# Patient Record
Sex: Male | Born: 1955 | Race: Black or African American | Hispanic: No | Marital: Married | Smoking: Never smoker
Health system: Southern US, Community
[De-identification: ages and names within clinical notes are randomized; demographics above are authoritative.]

## PROBLEM LIST (undated history)

## (undated) DIAGNOSIS — G629 Polyneuropathy, unspecified: Secondary | ICD-10-CM

## (undated) DIAGNOSIS — G8929 Other chronic pain: Secondary | ICD-10-CM

---

## 1998-01-14 ENCOUNTER — Emergency Department (HOSPITAL_COMMUNITY): Admission: EM | Admit: 1998-01-14 | Discharge: 1998-01-14 | Payer: Self-pay | Admitting: Emergency Medicine

## 2008-06-05 ENCOUNTER — Emergency Department (HOSPITAL_COMMUNITY): Admission: EM | Admit: 2008-06-05 | Discharge: 2008-06-05 | Payer: Self-pay | Admitting: Family Medicine

## 2008-06-25 ENCOUNTER — Emergency Department (HOSPITAL_COMMUNITY): Admission: EM | Admit: 2008-06-25 | Discharge: 2008-06-25 | Payer: Self-pay | Admitting: Family Medicine

## 2008-07-13 ENCOUNTER — Emergency Department (HOSPITAL_COMMUNITY): Admission: EM | Admit: 2008-07-13 | Discharge: 2008-07-13 | Payer: Self-pay | Admitting: Emergency Medicine

## 2008-08-07 ENCOUNTER — Emergency Department (HOSPITAL_COMMUNITY): Admission: EM | Admit: 2008-08-07 | Discharge: 2008-08-07 | Payer: Self-pay | Admitting: Family Medicine

## 2008-08-22 ENCOUNTER — Emergency Department (HOSPITAL_COMMUNITY): Admission: EM | Admit: 2008-08-22 | Discharge: 2008-08-22 | Payer: Self-pay | Admitting: Emergency Medicine

## 2008-09-13 ENCOUNTER — Emergency Department (HOSPITAL_COMMUNITY): Admission: EM | Admit: 2008-09-13 | Discharge: 2008-09-13 | Payer: Self-pay | Admitting: Emergency Medicine

## 2008-11-30 ENCOUNTER — Ambulatory Visit: Payer: Self-pay | Admitting: Internal Medicine

## 2008-12-06 ENCOUNTER — Ambulatory Visit: Payer: Self-pay | Admitting: *Deleted

## 2009-07-24 ENCOUNTER — Ambulatory Visit (HOSPITAL_COMMUNITY): Admission: RE | Admit: 2009-07-24 | Discharge: 2009-07-24 | Payer: Self-pay

## 2009-07-24 ENCOUNTER — Ambulatory Visit: Payer: Self-pay | Admitting: Family Medicine

## 2013-05-10 ENCOUNTER — Emergency Department (HOSPITAL_COMMUNITY): Payer: Medicare Other

## 2013-05-10 ENCOUNTER — Emergency Department (HOSPITAL_COMMUNITY)
Admission: EM | Admit: 2013-05-10 | Discharge: 2013-05-10 | Disposition: A | Discharge status: Home / Self Care | Payer: Medicare Other | Attending: Emergency Medicine | Admitting: Emergency Medicine

## 2013-05-10 ENCOUNTER — Encounter (HOSPITAL_COMMUNITY): Payer: Self-pay | Admitting: Emergency Medicine

## 2013-05-10 DIAGNOSIS — M1710 Unilateral primary osteoarthritis, unspecified knee: Secondary | ICD-10-CM | POA: Insufficient documentation

## 2013-05-10 DIAGNOSIS — M545 Low back pain, unspecified: Secondary | ICD-10-CM | POA: Insufficient documentation

## 2013-05-10 DIAGNOSIS — IMO0002 Reserved for concepts with insufficient information to code with codable children: Secondary | ICD-10-CM | POA: Insufficient documentation

## 2013-05-10 DIAGNOSIS — R030 Elevated blood-pressure reading, without diagnosis of hypertension: Secondary | ICD-10-CM | POA: Insufficient documentation

## 2013-05-10 DIAGNOSIS — M1712 Unilateral primary osteoarthritis, left knee: Secondary | ICD-10-CM

## 2013-05-10 DIAGNOSIS — Z79899 Other long term (current) drug therapy: Secondary | ICD-10-CM | POA: Insufficient documentation

## 2013-05-10 DIAGNOSIS — M19079 Primary osteoarthritis, unspecified ankle and foot: Secondary | ICD-10-CM | POA: Insufficient documentation

## 2013-05-10 DIAGNOSIS — M19072 Primary osteoarthritis, left ankle and foot: Secondary | ICD-10-CM

## 2013-05-10 DIAGNOSIS — IMO0001 Reserved for inherently not codable concepts without codable children: Secondary | ICD-10-CM

## 2013-05-10 DIAGNOSIS — G8929 Other chronic pain: Secondary | ICD-10-CM | POA: Insufficient documentation

## 2013-05-10 DIAGNOSIS — R209 Unspecified disturbances of skin sensation: Secondary | ICD-10-CM | POA: Insufficient documentation

## 2013-05-10 DIAGNOSIS — M1711 Unilateral primary osteoarthritis, right knee: Secondary | ICD-10-CM

## 2013-05-10 HISTORY — DX: Other chronic pain: G89.29

## 2013-05-10 MED ORDER — TRAMADOL HCL 50 MG PO TABS: 50.0000 mg | ORAL_TABLET | Freq: Four times a day (QID) | ORAL | Status: DC | PRN

## 2013-05-10 MED ORDER — AMLODIPINE BESYLATE 10 MG PO TABS
10.0000 mg | ORAL_TABLET | Freq: Once | ORAL | Status: AC
  Administered 2013-05-10: 10 mg via ORAL
  Filled 2013-05-10: qty 1

## 2013-05-10 MED ORDER — OXYCODONE-ACETAMINOPHEN 5-325 MG PO TABS
2.0000 | ORAL_TABLET | Freq: Once | ORAL | Status: AC
  Administered 2013-05-10: 2 via ORAL
  Filled 2013-05-10: qty 2

## 2013-05-10 MED ORDER — AMLODIPINE BESYLATE 10 MG PO TABS: 10.0000 mg | ORAL_TABLET | Freq: Every day | ORAL | Status: AC

## 2013-05-10 MED ORDER — ACETAMINOPHEN 500 MG PO TABS: 500.0000 mg | ORAL_TABLET | Freq: Four times a day (QID) | ORAL | Status: DC | PRN

## 2013-05-10 NOTE — ED Notes (Signed)
Ortho paged. 

## 2013-05-10 NOTE — ED Provider Notes (Signed)
CSN: [id number]     Arrival date & time 05/10/13  1710 History  This chart was scribed for non-physician practitioner Junius Finner working with Hilario Quarry, MD by Danella Maiers, ED Scribe. This patient was seen in room TR08C/TR08C and the patient's care was started at 5:36 PM.     Chief Complaint  Patient presents with  . Knee Pain   The history is provided by the patient. No language interpreter was used.   HPI Comments: Jacob Garza is a 57 y.o. male who presents to the Emergency Department complaining of exacerbation of chronic bilateral knee pain that has worsened to moderate throbbing pain recently. He denies new injuries. He has been walking with a walker for almost a year and states his knees feel weak. He also reports new left ankle pain with numbness and tingling. He also reports lower back pain. He has been taking Aleve and icy hot with minimal relief. He states it has been a while since he has seen an orthopedist and he does not have a PCP. He states he would like to have x-rays done.  Past Medical History  Diagnosis Date  . Chronic pain    History reviewed. No pertinent past surgical history. History reviewed. No pertinent family history. History  Substance Use Topics  . Smoking status: Never Smoker   . Smokeless tobacco: Not on file  . Alcohol Use: No    Review of Systems  Musculoskeletal: Positive for arthralgias.  All other systems reviewed and are negative.    Allergies  Review of patient's allergies indicates no known allergies.  Home Medications   Current Outpatient Rx  Name  Route  Sig  Dispense  Refill  . Menthol, Topical Analgesic, (ICY HOT EX)   Apply externally   Apply 1 application topically daily.         . naproxen sodium (ANAPROX) 220 MG tablet   Oral   Take 220 mg by mouth 2 (two) times daily with a meal.         . Omega-3 Fatty Acids (FISH OIL PO)   Oral   Take 2 tablets by mouth daily.         Marland Kitchen trolamine salicylate  (ASPERCREME) 10 % cream   Topical   Apply 1 application topically daily.         Marland Kitchen acetaminophen (TYLENOL) 500 MG tablet   Oral   Take 1 tablet (500 mg total) by mouth every 6 (six) hours as needed.   30 tablet   0   . amLODipine (NORVASC) 10 MG tablet   Oral   Take 1 tablet (10 mg total) by mouth daily.   30 tablet   0   . traMADol (ULTRAM) 50 MG tablet   Oral   Take 1 tablet (50 mg total) by mouth every 6 (six) hours as needed.   15 tablet   0    BP 161/100  Pulse 63  Temp(Src) 98.6 F (37 C) (Oral)  Resp 16  SpO2 99% Physical Exam  Nursing note and vitals reviewed. Constitutional: He is oriented to person, place, and time. He appears well-developed and well-nourished.  Pt sitting at edge of exam bed, sitting in a stiffened position.  Walker at his side.  HENT:  Head: Normocephalic and atraumatic.  Eyes: EOM are normal.  Neck: Normal range of motion.  Cardiovascular: Normal rate.   Pulmonary/Chest: Effort normal.  Musculoskeletal: He exhibits edema and tenderness.  Limited ROM to knees bilaterally secondary to  pain. Tenderness on the left knee - medial and lateral joint space. Tenderness on the right knee- only medial joint.  Neurological: He is alert and oriented to person, place, and time.  Skin: Skin is warm and dry. No rash noted. No erythema.  Skin in tact, no ecchymosis or erythema.  Psychiatric: He has a normal mood and affect. His behavior is normal.    ED Course  Procedures (including critical care time) Medications  oxyCODONE-acetaminophen (PERCOCET/ROXICET) 5-325 MG per tablet 2 tablet (2 tablets Oral Given 05/10/13 1918)  amLODipine (NORVASC) tablet 10 mg (10 mg Oral Given 05/10/13 2001)    DIAGNOSTIC STUDIES: Oxygen Saturation is 99% on RA, normal by my interpretation.    COORDINATION OF CARE: 6:00 PM- Discussed treatment plan with pt which includes left and right knee x-rays and left ankle x-ray. Pt agrees to plan.    Labs Review Labs  Reviewed - No data to display Imaging Review Dg Ankle Complete Left  05/10/2013   CLINICAL DATA:  Left ankle pain.  EXAM: LEFT ANKLE COMPLETE - 3+ VIEW  COMPARISON:  None.  FINDINGS: There is no evidence of fracture, dislocation, or joint effusion. Moderate to severe osteoarthritis is seen involving the ankle joint. No other bone lesions identified.  IMPRESSION: No acute findings.  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   Electronically Signed   By: Myles Rosenthal M.D.   On: 05/10/2013 18:54   Dg Knee Complete 4 Views Left  05/10/2013   CLINICAL DATA:  Left knee pain.  EXAM: LEFT KNEE - COMPLETE 4+ VIEW  COMPARISON:  July 24, 2009.  FINDINGS: No fracture or dislocation is noted. No joint effusion is noted. Severe narrowing of both the medial and lateral joint spaces is noted as well as the patellofemoral space. Significant osteophyte formation is noted as well.  IMPRESSION: Severe tricompartmental degenerative joint disease is noted. No acute abnormality seen.   Electronically Signed   By: Roque Lias M.D.   On: 05/10/2013 18:39   Dg Knee Complete 4 Views Right  05/10/2013   CLINICAL DATA:  Right knee pain.  EXAM: RIGHT KNEE - COMPLETE 4+ VIEW  COMPARISON:  July 24, 2009.  FINDINGS: No fracture or dislocation is noted. No joint effusion is noted. Severe narrowing of the medial and lateral joint spaces is noted as well as the patellofemoral space. Mild osteophyte formation is noted.  IMPRESSION: Severe tricompartmental degenerative joint disease.   Electronically Signed   By: Roque Lias M.D.   On: 05/10/2013 18:40    EKG Interpretation   None       MDM   1. Osteoarthritis of ankle, left   2. Osteoarthritis of left knee   3. Osteoarthritis of right knee   4. Elevated BP    Pt has extensive osteoarthritis in both knees, and left ankle.  Greatest in left knee.  No acute abnormalities seen.    Pt also found to have elevated BP in ED.  Pt states he does not have PCP and has not  been tx for HTN before.  Upon discharge, pt was found to have BP of 198/2mmHg.  Will start pt on 10mg  amlodipine.  Strongly encouraged pt to establish a PCP for further evaluation and tx of HTN and ongoing healthcare needs.  Also discussed f/u with [facility] orthopedist as pt may benefit greatly from knee joint and ankle joint injections.  Due to extensive OA in left knee, will likely need knee replacement at some point.  Rx: knees  sleeves, ASO left ankle, tramadol, acetaminophen, and amlodipine 10mg . Return precautions provided. Pt verbalized understanding and agreement with tx plan.  I personally performed the services described in this documentation, which was scribed in my presence. The recorded information has been reviewed and is accurate.    Junius Finner, PA-C 05/10/13 2029

## 2013-05-10 NOTE — Progress Notes (Signed)
Orthopedic Tech Progress Note Patient Details:  Jacob Garza [birth year] [id number]  Ortho Devices Type of Ortho Device: ASO;Knee Sleeve Ortho Device/Splint Location: both LLE Ortho Device/Splint Interventions: Ordered;Application   [name] 05/10/2013, 7:32 PM

## 2013-05-10 NOTE — ED Notes (Signed)
Pt c/o bilateral knee pain that is chronic in nature; pt sts left ankle pain; pt denies new injury

## 2013-05-14 NOTE — ED Provider Notes (Signed)
History/physical exam/procedure(s) were performed by non-physician practitioner and as supervising physician I was immediately available for consultation/collaboration. I have reviewed all notes and am in agreement with care and plan.   Romonda Parker S Trudee Chirino, MD 05/14/13 1523 

## 2013-06-19 ENCOUNTER — Ambulatory Visit: Payer: Medicare Other

## 2013-07-13 ENCOUNTER — Ambulatory Visit: Payer: Medicare Other | Admitting: Internal Medicine

## 2016-08-07 ENCOUNTER — Ambulatory Visit (HOSPITAL_COMMUNITY)
Admission: EM | Admit: 2016-08-07 | Discharge: 2016-08-07 | Disposition: A | Discharge status: Home / Self Care | Payer: Medicare Other | Attending: Family Medicine | Admitting: Family Medicine

## 2016-08-07 ENCOUNTER — Encounter (HOSPITAL_COMMUNITY): Payer: Self-pay

## 2016-08-07 DIAGNOSIS — M150 Primary generalized (osteo)arthritis: Secondary | ICD-10-CM

## 2016-08-07 DIAGNOSIS — M130 Polyarthritis, unspecified: Secondary | ICD-10-CM | POA: Diagnosis not present

## 2016-08-07 DIAGNOSIS — M159 Polyosteoarthritis, unspecified: Secondary | ICD-10-CM

## 2016-08-07 MED ORDER — TRAMADOL HCL 50 MG PO TABS: 50.0000 mg | ORAL_TABLET | Freq: Four times a day (QID) | ORAL | 0 refills | Status: DC | PRN

## 2016-08-07 MED ORDER — NAPROXEN 375 MG PO TABS: 375.0000 mg | ORAL_TABLET | Freq: Two times a day (BID) | ORAL | 0 refills | Status: DC

## 2016-08-07 NOTE — ED Provider Notes (Signed)
CSN: [id number]     Arrival date & time 08/07/16  1843 History   First MD Initiated Contact with Patient 08/07/16 2012     Chief Complaint  Patient presents with  . Joint Swelling   (Consider location/radiation/quality/duration/timing/severity/associated sxs/prior Treatment) 61 year old male complaining of joint pain and swelling for several years. It is noted that in 2014 he was in the emergency department with the same complaints and was told he needed to follow-up with a primary care provider. He states that he is making no effort to do that. He states that his joint pain and swelling is getting worse. He has been diagnosed with osteoarthritis. Has been taking Aleve and Tylenol without relief.      Past Medical History:  Diagnosis Date  . Chronic pain    History reviewed. No pertinent surgical history. No family history on file. Social History  Substance Use Topics  . Smoking status: Never Smoker  . Smokeless tobacco: Never Used  . Alcohol use No    Review of Systems  Constitutional: Negative.   HENT: Negative.   Respiratory: Negative.   Cardiovascular: Negative.   Musculoskeletal: Positive for arthralgias.  Neurological: Negative.   All other systems reviewed and are negative.   Allergies  Patient has no known allergies.  Home Medications   Prior to Admission medications   Medication Sig Start Date End Date Taking? Authorizing Provider  acetaminophen (TYLENOL) 500 MG tablet Take 1 tablet (500 mg total) by mouth every 6 (six) hours as needed. 05/10/13  Yes Junius Finner, PA-C  naproxen sodium (ANAPROX) 220 MG tablet Take 220 mg by mouth 2 (two) times daily with a meal.   Yes Historical Provider, MD  amLODipine (NORVASC) 10 MG tablet Take 1 tablet (10 mg total) by mouth daily. 05/10/13   Junius Finner, PA-C  Menthol, Topical Analgesic, (ICY HOT EX) Apply 1 application topically daily.    Historical Provider, MD  naproxen (NAPROSYN) 375 MG tablet Take 1 tablet (375 mg  total) by mouth 2 (two) times daily. Take with food 08/07/16   Hayden Rasmussen, NP  Omega-3 Fatty Acids (FISH OIL PO) Take 2 tablets by mouth daily.    Historical Provider, MD  traMADol (ULTRAM) 50 MG tablet Take 1 tablet (50 mg total) by mouth every 6 (six) hours as needed. 08/07/16   Hayden Rasmussen, NP  trolamine salicylate (ASPERCREME) 10 % cream Apply 1 application topically daily.    Historical Provider, MD   Meds Ordered and Administered this Visit  Medications - No data to display  BP 167/80 (BP Location: Left Arm)   Pulse (!) 51   Temp 98.3 F (36.8 C) (Oral)   Resp 18   SpO2 100%  No data found.   Physical Exam  Constitutional: He is oriented to person, place, and time. He appears well-developed and well-nourished.  HENT:  Head: Normocephalic and atraumatic.  Neck: Normal range of motion. Neck supple.  Pulmonary/Chest: Effort normal.  Musculoskeletal: He exhibits edema, tenderness and deformity.  Swelling of both knees and both ankles. Minor swelling of the hands with joint deformities characteristic of a osteoarthritis. No erythema or signs of localized infection.  Neurological: He is alert and oriented to person, place, and time. No sensory deficit.  Skin: Skin is warm.  Psychiatric: He has a normal mood and affect.  Nursing note and vitals reviewed.   Urgent Care Course     Procedures (including critical care time)  Labs Review Labs Reviewed - No data to display  Imaging Review No results found.   Visual Acuity Review  Right Eye Distance:   Left Eye Distance:   Bilateral Distance:    Right Eye Near:   Left Eye Near:    Bilateral Near:         MDM   1. Primary osteoarthritis involving multiple joints   2. Polyarthritis   You have bad osteoarthritis of your joints and you need to see a primary care provider for additional care. There are telephone numbers listed in your instructions on the first page as well as in a dark rectangular box in the back of your  instructions. Take the medications as prescribed. He will also have mild elevation in your blood pressure he will also need to have that evaluated as well. Meds ordered this encounter  Medications  . naproxen (NAPROSYN) 375 MG tablet    Sig: Take 1 tablet (375 mg total) by mouth 2 (two) times daily. Take with food    Dispense:  20 tablet    Refill:  0    Order Specific Question:   Supervising Provider    Answer:   Linna Hoff [phone]  . traMADol (ULTRAM) 50 MG tablet    Sig: Take 1 tablet (50 mg total) by mouth every 6 (six) hours as needed.    Dispense:  15 tablet    Refill:  0    Order Specific Question:   Supervising Provider    Answer:   Linna Hoff [[id number]]       Hayden Rasmussen, NP 08/07/16 2031

## 2016-08-07 NOTE — ED Triage Notes (Signed)
Pt having swelling in his ankles, wrist, knees, hands and fingers for over a year. Unable to do his ADL. Taking advil and tylenol

## 2016-08-07 NOTE — Discharge Instructions (Signed)
You have bad osteoarthritis of your joints and you need to see a primary care provider for additional care. There are telephone numbers listed in your instructions on the first page as well as in a dark rectangular box in the back of your instructions. Take the medications as prescribed. He will also have mild elevation in your blood pressure he will also need to have that evaluated as well.

## 2018-06-14 ENCOUNTER — Emergency Department (HOSPITAL_COMMUNITY): Payer: Medicare Other

## 2018-06-14 ENCOUNTER — Emergency Department (HOSPITAL_COMMUNITY)
Admission: EM | Admit: 2018-06-14 | Discharge: 2018-06-15 | Disposition: A | Discharge status: Home / Self Care | Payer: Medicare Other | Attending: Emergency Medicine | Admitting: Emergency Medicine

## 2018-06-14 ENCOUNTER — Encounter (HOSPITAL_COMMUNITY): Payer: Self-pay | Admitting: Emergency Medicine

## 2018-06-14 DIAGNOSIS — M7989 Other specified soft tissue disorders: Secondary | ICD-10-CM | POA: Diagnosis not present

## 2018-06-14 DIAGNOSIS — S8991XA Unspecified injury of right lower leg, initial encounter: Secondary | ICD-10-CM | POA: Diagnosis not present

## 2018-06-14 DIAGNOSIS — Z79899 Other long term (current) drug therapy: Secondary | ICD-10-CM | POA: Insufficient documentation

## 2018-06-14 DIAGNOSIS — S99912A Unspecified injury of left ankle, initial encounter: Secondary | ICD-10-CM | POA: Diagnosis not present

## 2018-06-14 DIAGNOSIS — M25562 Pain in left knee: Secondary | ICD-10-CM | POA: Diagnosis not present

## 2018-06-14 DIAGNOSIS — M25572 Pain in left ankle and joints of left foot: Secondary | ICD-10-CM | POA: Diagnosis not present

## 2018-06-14 DIAGNOSIS — W1789XA Other fall from one level to another, initial encounter: Secondary | ICD-10-CM | POA: Insufficient documentation

## 2018-06-14 DIAGNOSIS — M069 Rheumatoid arthritis, unspecified: Secondary | ICD-10-CM | POA: Diagnosis not present

## 2018-06-14 DIAGNOSIS — S6991XA Unspecified injury of right wrist, hand and finger(s), initial encounter: Secondary | ICD-10-CM | POA: Diagnosis not present

## 2018-06-14 DIAGNOSIS — M25531 Pain in right wrist: Secondary | ICD-10-CM | POA: Diagnosis not present

## 2018-06-14 DIAGNOSIS — S59912A Unspecified injury of left forearm, initial encounter: Secondary | ICD-10-CM | POA: Diagnosis not present

## 2018-06-14 DIAGNOSIS — M25561 Pain in right knee: Secondary | ICD-10-CM

## 2018-06-14 DIAGNOSIS — M25532 Pain in left wrist: Secondary | ICD-10-CM | POA: Diagnosis not present

## 2018-06-14 DIAGNOSIS — S8992XA Unspecified injury of left lower leg, initial encounter: Secondary | ICD-10-CM | POA: Diagnosis not present

## 2018-06-14 DIAGNOSIS — M79632 Pain in left forearm: Secondary | ICD-10-CM | POA: Diagnosis not present

## 2018-06-14 DIAGNOSIS — W19XXXA Unspecified fall, initial encounter: Secondary | ICD-10-CM

## 2018-06-14 NOTE — ED Notes (Signed)
Patient transported to X-ray 

## 2018-06-14 NOTE — ED Provider Notes (Signed)
[facility] EMERGENCY DEPARTMENT Provider Note   CSN: [id number] Arrival date & time: 06/14/18  2100     History   Chief Complaint Chief Complaint  Patient presents with  . Fall  . Leg Pain    HPI Jacob Garza is a 62 y.o. male.  Patient presents to the emergency department with a chief complaint of bilateral wrist and bilateral knee pain.  He states that he slipped while getting out of the bathtub yesterday.  He has severe rheumatoid arthritis, and does not ambulate at baseline.  He uses a wheelchair.  While transferring, he fell to the ground striking his left forearm, catching himself with his right wrist, and landing on bilateral knees.  He states that his symptoms worsened today.  No treatments prior to arrival.  He did not hit his head or pass out.  Denies any other associated symptoms.  The history is provided by the patient. No language interpreter was used.    Past Medical History:  Diagnosis Date  . Chronic pain     There are no active problems to display for this patient.   History reviewed. No pertinent surgical history.      Home Medications    Prior to Admission medications   Medication Sig Start Date End Date Taking? Authorizing Provider  acetaminophen (TYLENOL) 500 MG tablet Take 1 tablet (500 mg total) by mouth every 6 (six) hours as needed. 05/10/13   Lurene Shadow, PA-C  amLODipine (NORVASC) 10 MG tablet Take 1 tablet (10 mg total) by mouth daily. 05/10/13   Lurene Shadow, PA-C  Menthol, Topical Analgesic, (ICY HOT EX) Apply 1 application topically daily.    [provider]  naproxen (NAPROSYN) 375 MG tablet Take 1 tablet (375 mg total) by mouth 2 (two) times daily. Take with food 08/07/16   Hayden Rasmussen, NP  naproxen sodium (ANAPROX) 220 MG tablet Take 220 mg by mouth 2 (two) times daily with a meal.    [provider]  Omega-3 Fatty Acids (FISH OIL PO) Take 2 tablets by mouth daily.    [provider]    traMADol (ULTRAM) 50 MG tablet Take 1 tablet (50 mg total) by mouth every 6 (six) hours as needed. 08/07/16   Hayden Rasmussen, NP  trolamine salicylate (ASPERCREME) 10 % cream Apply 1 application topically daily.    [provider]    Family History No family history on file.  Social History Social History   Tobacco Use  . Smoking status: Never Smoker  . Smokeless tobacco: Never Used  Substance Use Topics  . Alcohol use: No  . Drug use: No     Allergies   Patient has no known allergies.   Review of Systems Review of Systems  All other systems reviewed and are negative.    Physical Exam Updated Vital Signs BP (!) 172/108 (BP Location: Right Arm)   Pulse (!) 55   Temp 98 F (36.7 C) (Oral)   Resp 16   Ht 6\' 4"  (1.93 m)   Wt 106.6 kg   SpO2 98%   BMI 28.61 kg/m   Physical Exam Vitals signs and nursing note reviewed.  Constitutional:      Appearance: He is well-developed.  HENT:     Head: Normocephalic and atraumatic.  Eyes:     Conjunctiva/sclera: Conjunctivae normal.  Neck:     Musculoskeletal: Normal range of motion.  Cardiovascular:     Rate and Rhythm: Normal rate.  Pulmonary:  Effort: Pulmonary effort is normal.  Abdominal:     General: There is no distension.  Musculoskeletal: Normal range of motion.     Comments: Bilateral wrists are without acute bony abnormality or deformity, he does have chronic changes related to rheumatoid arthritis and bilateral hands and fingers  Bilateral knees are tender to palpation, but without crepitus or bony abnormality or deformity  Range of motion is limited secondary to pain  Skin:    General: Skin is dry.  Neurological:     Mental Status: He is alert and oriented to person, place, and time.  Psychiatric:        Behavior: Behavior normal.        Thought Content: Thought content normal.        Judgment: Judgment normal.      ED Treatments / Results  Labs (all labs ordered are listed, but only  abnormal results are displayed) Labs Reviewed - No data to display  EKG None  Radiology Dg Wrist Complete Right  Result Date: 06/14/2018 CLINICAL DATA:  Status post fall in tub, with right wrist swelling and pain. Initial encounter. EXAM: RIGHT WRIST - COMPLETE 3+ VIEW COMPARISON:  None. FINDINGS: There is no evidence of fracture or dislocation. There is degenerative flattening of the lunate. Subcortical cystic change and joint space loss is also noted at the second carpometacarpal joint. No significant soft tissue abnormalities are seen. IMPRESSION: No evidence of fracture or dislocation. Degenerative change about the carpus. Electronically Signed   By: Roanna Raider M.D.   On: 06/14/2018 22:04   Dg Ankle Complete Left  Result Date: 06/14/2018 CLINICAL DATA:  Status post fall in tub, with medial left ankle pain. Initial encounter. EXAM: LEFT ANKLE COMPLETE - 3+ VIEW COMPARISON:  None. FINDINGS: There is no evidence of fracture or dislocation. The ankle mortise is grossly intact; the interosseous space is within normal limits. No talar tilt or subluxation is seen. Degenerative change is noted at the ankle mortise and at the subtalar joint, with suggestion of underlying osseous fusion. Degenerative change is also noted at the midfoot, with question of osseous fusion. Soft tissue swelling is noted about the ankle. IMPRESSION: No evidence of fracture or dislocation. Degenerative change about the ankle and midfoot. Electronically Signed   By: Roanna Raider M.D.   On: 06/14/2018 22:02    Procedures Procedures (including critical care time)  Medications Ordered in ED Medications - No data to display   Initial Impression / Assessment and Plan / ED Course  I have reviewed the triage vital signs and the nursing notes.  Pertinent labs & imaging results that were available during my care of the patient were reviewed by me and considered in my medical decision making (see chart for details).      Patient presents with injury to bilateral knees and wrists.  DDx includes, fracture, strain, or sprain.  Consultants: none  Plain films reveal no acute process.  Pt advised to follow up with PCP and/or orthopedics. Patient given conservative therapy such as RICE recommended and discussed.   Patient will be discharged home & is agreeable with above plan. Returns precautions discussed. Pt appears safe for discharge.   Final Clinical Impressions(s) / ED Diagnoses   Final diagnoses:  Fall, initial encounter  Acute pain of both knees    ED Discharge Orders    None       Roxy Horseman, PA-C 06/15/18 0023    Cathren Laine, MD 06/15/18 [phone]

## 2018-06-14 NOTE — ED Triage Notes (Signed)
Pt reports a mechanical fall getting out of the bathtub yesterday morning (12/30).  Fell onto his knees and ankles.  Did not hit his head and no LOC.  Pt reports right wrist and left ankle pain along w/ bilateral knee pain.

## 2019-03-05 ENCOUNTER — Other Ambulatory Visit: Payer: Self-pay

## 2019-03-05 ENCOUNTER — Inpatient Hospital Stay (HOSPITAL_COMMUNITY)
Admission: EM | Admit: 2019-03-05 | Discharge: 2019-03-09 | DRG: 871 | Disposition: A | Discharge status: Skilled Nursing Facility | Payer: Medicare Other | Attending: Internal Medicine | Admitting: Internal Medicine

## 2019-03-05 ENCOUNTER — Inpatient Hospital Stay (HOSPITAL_COMMUNITY): Payer: Medicare Other

## 2019-03-05 ENCOUNTER — Encounter (HOSPITAL_COMMUNITY): Payer: Self-pay | Admitting: Internal Medicine

## 2019-03-05 ENCOUNTER — Emergency Department (HOSPITAL_COMMUNITY): Payer: Medicare Other

## 2019-03-05 DIAGNOSIS — N179 Acute kidney failure, unspecified: Secondary | ICD-10-CM

## 2019-03-05 DIAGNOSIS — M25569 Pain in unspecified knee: Secondary | ICD-10-CM | POA: Diagnosis present

## 2019-03-05 DIAGNOSIS — Z993 Dependence on wheelchair: Secondary | ICD-10-CM

## 2019-03-05 DIAGNOSIS — Z20828 Contact with and (suspected) exposure to other viral communicable diseases: Secondary | ICD-10-CM | POA: Diagnosis present

## 2019-03-05 DIAGNOSIS — G629 Polyneuropathy, unspecified: Secondary | ICD-10-CM | POA: Diagnosis present

## 2019-03-05 DIAGNOSIS — E875 Hyperkalemia: Secondary | ICD-10-CM | POA: Diagnosis present

## 2019-03-05 DIAGNOSIS — G894 Chronic pain syndrome: Secondary | ICD-10-CM | POA: Diagnosis present

## 2019-03-05 DIAGNOSIS — K8020 Calculus of gallbladder without cholecystitis without obstruction: Secondary | ICD-10-CM | POA: Diagnosis present

## 2019-03-05 DIAGNOSIS — M1990 Unspecified osteoarthritis, unspecified site: Secondary | ICD-10-CM | POA: Diagnosis present

## 2019-03-05 DIAGNOSIS — M793 Panniculitis, unspecified: Secondary | ICD-10-CM | POA: Diagnosis present

## 2019-03-05 DIAGNOSIS — N390 Urinary tract infection, site not specified: Secondary | ICD-10-CM | POA: Diagnosis present

## 2019-03-05 DIAGNOSIS — A419 Sepsis, unspecified organism: Secondary | ICD-10-CM | POA: Diagnosis present

## 2019-03-05 DIAGNOSIS — N281 Cyst of kidney, acquired: Secondary | ICD-10-CM | POA: Diagnosis present

## 2019-03-05 DIAGNOSIS — M6282 Rhabdomyolysis: Secondary | ICD-10-CM | POA: Diagnosis present

## 2019-03-05 DIAGNOSIS — Z791 Long term (current) use of non-steroidal anti-inflammatories (NSAID): Secondary | ICD-10-CM | POA: Diagnosis not present

## 2019-03-05 DIAGNOSIS — I10 Essential (primary) hypertension: Secondary | ICD-10-CM | POA: Diagnosis present

## 2019-03-05 DIAGNOSIS — W050XXA Fall from non-moving wheelchair, initial encounter: Secondary | ICD-10-CM | POA: Diagnosis present

## 2019-03-05 DIAGNOSIS — E870 Hyperosmolality and hypernatremia: Secondary | ICD-10-CM | POA: Diagnosis present

## 2019-03-05 DIAGNOSIS — L89312 Pressure ulcer of right buttock, stage 2: Secondary | ICD-10-CM | POA: Diagnosis present

## 2019-03-05 DIAGNOSIS — L89322 Pressure ulcer of left buttock, stage 2: Secondary | ICD-10-CM | POA: Diagnosis present

## 2019-03-05 DIAGNOSIS — R945 Abnormal results of liver function studies: Secondary | ICD-10-CM

## 2019-03-05 DIAGNOSIS — N5089 Other specified disorders of the male genital organs: Secondary | ICD-10-CM | POA: Diagnosis present

## 2019-03-05 DIAGNOSIS — N170 Acute kidney failure with tubular necrosis: Secondary | ICD-10-CM | POA: Diagnosis present

## 2019-03-05 DIAGNOSIS — Z23 Encounter for immunization: Secondary | ICD-10-CM | POA: Diagnosis present

## 2019-03-05 DIAGNOSIS — A4159 Other Gram-negative sepsis: Principal | ICD-10-CM | POA: Diagnosis present

## 2019-03-05 HISTORY — DX: Polyneuropathy, unspecified: G62.9

## 2019-03-05 LAB — CREATININE, SERUM
Creatinine, Ser: 6.67 mg/dL — ABNORMAL HIGH (ref 0.61–1.24)
GFR calc Af Amer: 10 mL/min — ABNORMAL LOW (ref >60)
GFR calc non Af Amer: 8 mL/min — ABNORMAL LOW (ref >60)

## 2019-03-05 LAB — URINALYSIS, ROUTINE W REFLEX MICROSCOPIC
Bilirubin Urine: NEGATIVE
Glucose, UA: NEGATIVE mg/dL
Ketones, ur: 5 mg/dL — AB
Nitrite: NEGATIVE
Protein, ur: 100 mg/dL — AB
Specific Gravity, Urine: 1.012 (ref 1.005–1.030)
WBC, UA: >50 WBC/hpf — ABNORMAL HIGH (ref 0–5)
pH: 6 (ref 5.0–8.0)

## 2019-03-05 LAB — CBC WITH DIFFERENTIAL/PLATELET
Abs Immature Granulocytes: 0.25 10*3/uL — ABNORMAL HIGH (ref 0.00–0.07)
Basophils Absolute: 0.1 10*3/uL (ref 0.0–0.1)
Basophils Relative: 0 %
Eosinophils Absolute: 0 10*3/uL (ref 0.0–0.5)
Eosinophils Relative: 0 %
HCT: 46 % (ref 39.0–52.0)
Hemoglobin: 15 g/dL (ref 13.0–17.0)
Immature Granulocytes: 1 %
Lymphocytes Relative: 5 %
Lymphs Abs: 0.9 10*3/uL (ref 0.7–4.0)
MCH: 31.5 pg (ref 26.0–34.0)
MCHC: 32.6 g/dL (ref 30.0–36.0)
MCV: 96.6 fL (ref 80.0–100.0)
Monocytes Absolute: 0.7 10*3/uL (ref 0.1–1.0)
Monocytes Relative: 4 %
Neutro Abs: 17.1 10*3/uL — ABNORMAL HIGH (ref 1.7–7.7)
Neutrophils Relative %: 90 %
Platelets: 318 10*3/uL (ref 150–400)
RBC: 4.76 MIL/uL (ref 4.22–5.81)
RDW: 14.6 % (ref 11.5–15.5)
WBC: 19 10*3/uL — ABNORMAL HIGH (ref 4.0–10.5)
nRBC: 0 % (ref 0.0–0.2)

## 2019-03-05 LAB — LACTIC ACID, PLASMA: Lactic Acid, Venous: 2 mmol/L (ref 0.5–1.9)

## 2019-03-05 LAB — CREATININE, URINE, RANDOM: Creatinine, Urine: 74.14 mg/dL

## 2019-03-05 LAB — HEPATIC FUNCTION PANEL
ALT: 72 U/L — ABNORMAL HIGH (ref 0–44)
AST: 102 U/L — ABNORMAL HIGH (ref 15–41)
Albumin: 2.6 g/dL — ABNORMAL LOW (ref 3.5–5.0)
Alkaline Phosphatase: 218 U/L — ABNORMAL HIGH (ref 38–126)
Bilirubin, Direct: 1.5 mg/dL — ABNORMAL HIGH (ref 0.0–0.2)
Indirect Bilirubin: 1.4 mg/dL — ABNORMAL HIGH (ref 0.3–0.9)
Total Bilirubin: 2.9 mg/dL — ABNORMAL HIGH (ref 0.3–1.2)
Total Protein: 7.8 g/dL (ref 6.5–8.1)

## 2019-03-05 LAB — SODIUM, URINE, RANDOM: Sodium, Ur: 52 mmol/L

## 2019-03-05 LAB — BASIC METABOLIC PANEL
Anion gap: 25 — ABNORMAL HIGH (ref 5–15)
BUN: 82 mg/dL — ABNORMAL HIGH (ref 6–20)
CO2: 12 mmol/L — ABNORMAL LOW (ref 22–32)
Calcium: 8.2 mg/dL — ABNORMAL LOW (ref 8.9–10.3)
Chloride: 105 mmol/L (ref 98–111)
Creatinine, Ser: 6.82 mg/dL — ABNORMAL HIGH (ref 0.61–1.24)
GFR calc Af Amer: 9 mL/min — ABNORMAL LOW (ref >60)
GFR calc non Af Amer: 8 mL/min — ABNORMAL LOW (ref >60)
Glucose, Bld: 83 mg/dL (ref 70–99)
Potassium: 5.5 mmol/L — ABNORMAL HIGH (ref 3.5–5.1)
Sodium: 142 mmol/L (ref 135–145)

## 2019-03-05 LAB — TROPONIN I (HIGH SENSITIVITY): Troponin I (High Sensitivity): 15 ng/L (ref <18)

## 2019-03-05 LAB — PROTIME-INR
INR: 1.3 — ABNORMAL HIGH (ref 0.8–1.2)
Prothrombin Time: 16.2 seconds — ABNORMAL HIGH (ref 11.4–15.2)

## 2019-03-05 LAB — APTT: aPTT: 34 seconds (ref 24–36)

## 2019-03-05 LAB — CK: Total CK: 2155 U/L — ABNORMAL HIGH (ref 49–397)

## 2019-03-05 LAB — PROCALCITONIN: Procalcitonin: >150 ng/mL

## 2019-03-05 LAB — SALICYLATE LEVEL: Salicylate Lvl: <7 mg/dL (ref 2.8–30.0)

## 2019-03-05 MED ORDER — HEPARIN SODIUM (PORCINE) 5000 UNIT/ML IJ SOLN
5000.0000 [IU] | Freq: Three times a day (TID) | INTRAMUSCULAR | Status: DC
  Administered 2019-03-06 – 2019-03-09 (×10): 5000 [IU] via SUBCUTANEOUS
  Filled 2019-03-05 (×12): qty 1

## 2019-03-05 MED ORDER — SODIUM CHLORIDE 0.9 % IV SOLN
INTRAVENOUS | Status: DC
  Administered 2019-03-06: 08:00:00 175 mL/h via INTRAVENOUS

## 2019-03-05 MED ORDER — SODIUM CHLORIDE 0.9 % IV BOLUS
1000.0000 mL | Freq: Once | INTRAVENOUS | Status: AC
  Administered 2019-03-05: 18:00:00 1000 mL via INTRAVENOUS

## 2019-03-05 MED ORDER — HYDROMORPHONE HCL 1 MG/ML IJ SOLN
0.5000 mg | Freq: Once | INTRAMUSCULAR | Status: AC
  Administered 2019-03-05: 0.5 mg via INTRAVENOUS
  Filled 2019-03-05: qty 1

## 2019-03-05 MED ORDER — SODIUM BICARBONATE 8.4 % IV SOLN
50.0000 meq | Freq: Once | INTRAVENOUS | Status: AC
  Administered 2019-03-05: 22:00:00 50 meq via INTRAVENOUS
  Filled 2019-03-05: qty 50

## 2019-03-05 MED ORDER — VANCOMYCIN HCL 10 G IV SOLR
2000.0000 mg | Freq: Once | INTRAVENOUS | Status: DC
  Filled 2019-03-05: qty 2000

## 2019-03-05 MED ORDER — NYSTATIN 100000 UNIT/GM EX POWD
Freq: Three times a day (TID) | CUTANEOUS | Status: DC
  Administered 2019-03-06: 1 via TOPICAL
  Administered 2019-03-06 – 2019-03-09 (×8): via TOPICAL
  Filled 2019-03-05 (×2): qty 15

## 2019-03-05 MED ORDER — VANCOMYCIN HCL IN DEXTROSE 1-5 GM/200ML-% IV SOLN
1000.0000 mg | Freq: Once | INTRAVENOUS | Status: DC
  Administered 2019-03-05: 22:00:00 1000 mg via INTRAVENOUS
  Filled 2019-03-05: qty 200

## 2019-03-05 MED ORDER — SODIUM CHLORIDE 0.9 % IV BOLUS: 2000.0000 mL | Freq: Once | INTRAVENOUS | Status: DC

## 2019-03-05 MED ORDER — SODIUM CHLORIDE 0.9 % IV SOLN
2.0000 g | Freq: Once | INTRAVENOUS | Status: AC
  Administered 2019-03-05: 22:00:00 2 g via INTRAVENOUS
  Filled 2019-03-05: qty 2

## 2019-03-05 MED ORDER — VANCOMYCIN HCL IN DEXTROSE 1-5 GM/200ML-% IV SOLN
1000.0000 mg | Freq: Once | INTRAVENOUS | Status: AC
  Administered 2019-03-05: 22:00:00 1000 mg via INTRAVENOUS
  Filled 2019-03-05: qty 200

## 2019-03-05 MED ORDER — CALCIUM GLUCONATE-NACL 1-0.675 GM/50ML-% IV SOLN
1.0000 g | Freq: Once | INTRAVENOUS | Status: AC
  Administered 2019-03-05: 22:00:00 1000 mg via INTRAVENOUS
  Filled 2019-03-05: qty 50

## 2019-03-05 NOTE — ED Notes (Signed)
Patient was soiled. Patient cleaned up and blood drawn. Patient temp 95.3 rectal. MD notified waiting on orders for blanket warmer. Applied warm blankets until order is placed.

## 2019-03-05 NOTE — Progress Notes (Signed)
A consult was received from an ED physician for cefepime and vancomycin per pharmacy dosing.  The patient's profile has been reviewed for ht/wt/allergies/indication/available labs.   A one time order has been placed for cefepime 2 Gm and Vancomcyin 2 Gm.  Further antibiotics/pharmacy consults should be ordered by admitting physician if indicated.                       Thank you, Dorrene German 03/05/2019  9:33 PM

## 2019-03-05 NOTE — ED Notes (Signed)
Ordered blanket for the blanket warmer

## 2019-03-05 NOTE — H&P (Signed)
TRH H&P    Patient Demographics:    Jacob Garza, is a 63 y.o. male  MRN: [id number]  DOB - Aug 29, 1955  Admit Date - 03/05/2019  Referring MD/NP/PA: Alvester Chou  Outpatient Primary MD for the patient is Patient, No Pcp Per  Patient coming from: home, [facility]   Chief complaint- found on floor   HPI:    Jacob Garza  is a 63 y.o. male, w wheelchair bound due to chronic knee pain,  fell out of wheel chair 2 days ago. He was too weak to get up.   Pt was found on floor today.   Apparently  Found by hotel staff today.  Pt denies syncope.    In ED,  T 94.4 P 73 R 22 Bp 126/86  Pox 99% on RA Wt 108.9 kg  CXR IMPRESSION: No acute abnormality of the lungs in AP portable projection.  Na 142, K 5.5, Bun 82, Creatinine 6.82  Glucose 83 Hco3 12 AG 25  Wbc 19.0, Hgb 15.0, Plt 318  CPK 2,155 INR 1.3  Urinalysis wbc >50, rbc 11-20  Pt given cefepime 2gm iv x1, vanco 1gm iv x1 in ED,   Pt will be admitted for ARF, and rhabdomyolysis and hyperkalemia   Review of systems:    In addition to the HPI above,  No Fever-chills, No Headache, No changes with Vision or hearing, No problems swallowing food or Liquids, No Chest pain, Cough or Shortness of Breath, No Abdominal pain, No Nausea or Vomiting, bowel movements are regular, No Blood in stool or Urine, No dysuria, No new skin rashes or bruises,  No new weakness, tingling, numbness in any extremity, No recent weight gain or loss, No polyuria, polydypsia or polyphagia, No significant Mental Stressors.  All other systems reviewed and are negative.    Past History of the following :    Past Medical History:  Diagnosis Date  . Chronic pain   . Neuropathy       History reviewed. No pertinent surgical history. No prior surgeries   Social History:      Social History   Tobacco Use  . Smoking status: Never Smoker  .  Smokeless tobacco: Never Used  Substance Use Topics  . Alcohol use: No       Family History :    History reviewed. No pertinent family history. Pt thinks possibly hypertension    Home Medications:   Prior to Admission medications   Medication Sig Start Date End Date Taking? Authorizing Provider  naproxen sodium (ANAPROX) 220 MG tablet Take 220 mg by mouth 2 (two) times daily as needed (pain).    Yes [provider]  acetaminophen (TYLENOL) 500 MG tablet Take 1 tablet (500 mg total) by mouth every 6 (six) hours as needed. Patient not taking: Reported on 03/05/2019 05/10/13   Lurene Shadow, PA-C  amLODipine (NORVASC) 10 MG tablet Take 1 tablet (10 mg total) by mouth daily. Patient not taking: Reported on 03/05/2019 05/10/13   Lurene Shadow, PA-C  naproxen (NAPROSYN) 375 MG  tablet Take 1 tablet (375 mg total) by mouth 2 (two) times daily. Take with food Patient not taking: Reported on 03/05/2019 08/07/16   Hayden Rasmussen, NP  traMADol (ULTRAM) 50 MG tablet Take 1 tablet (50 mg total) by mouth every 6 (six) hours as needed. Patient not taking: Reported on 03/05/2019 08/07/16   Hayden Rasmussen, NP     Allergies:    No Known Allergies   Physical Exam:   Vitals  Blood pressure (!) 140/102, pulse 82, temperature (!) 95.3 F (35.2 C), temperature source Rectal, resp. rate 19, height  (1.956 m), weight 108.9 kg, SpO2 99 %.  1.  General: axoxo3  2. Psychiatric: euthymic  3. Neurologic: cn2-12 intact, reflexes 2+ symmetric, diffuse with no clonus, motor 5/5 in all 4 ext  4. HEENMT:  Anicteric, pupils 1.82mm symmetric, direct, consensual, near intact Neck: no jvd  5. Respiratory : CTAB  6. Cardiovascular : rrr s1, s2,   7. Gastrointestinal:  Abd: soft, nt, nd, +bs  8. Skin:  Ext: no c/c/e,  Pt has irritation of skin in bilateral groin, inguinal area and buttock.  (? Tinea vs cellultiis) Left hand ulnar deviation of fingers, slight synovitis vs tophi of the 2nd, 3rd,  5 th mcp joints.  Dry skin on feet, pes planus  9.Musculoskeletal:  Good ROM    Data Review:    CBC Recent Labs  Lab 03/05/19 1820  WBC 19.0*  HGB 15.0  HCT 46.0  PLT 318  MCV 96.6  MCH 31.5  MCHC 32.6  RDW 14.6  LYMPHSABS 0.9  MONOABS 0.7  EOSABS 0.0  BASOSABS 0.1   ------------------------------------------------------------------------------------------------------------------  Results for orders placed or performed during the hospital encounter of 03/05/19 (from the past 48 hour(s))  Basic metabolic panel     Status: Abnormal   Collection Time: 03/05/19  6:20 PM  Result Value Ref Range   Sodium 142 135 - 145 mmol/L   Potassium 5.5 (H) 3.5 - 5.1 mmol/L   Chloride 105 98 - 111 mmol/L   CO2 12 (L) 22 - 32 mmol/L   Glucose, Bld 83 70 - 99 mg/dL   BUN 82 (H) 6 - 20 mg/dL    Comment: RESULTS CONFIRMED BY MANUAL DILUTION   Creatinine, Ser 6.82 (H) 0.61 - 1.24 mg/dL   Calcium 8.2 (L) 8.9 - 10.3 mg/dL   GFR calc non Af Amer 8 (L) >60 mL/min   GFR calc Af Amer 9 (L) >60 mL/min   Anion gap 25 (H) 5 - 15    Comment: Performed at [facility], [address], [place], [place] [address]  CBC with Differential     Status: Abnormal   Collection Time: 03/05/19  6:20 PM  Result Value Ref Range   WBC 19.0 (H) 4.0 - 10.5 K/uL   RBC 4.76 4.22 - 5.81 MIL/uL   Hemoglobin 15.0 13.0 - 17.0 g/dL   HCT 60.4 54.0 - 98.1 %   MCV 96.6 80.0 - 100.0 fL   MCH 31.5 26.0 - 34.0 pg   MCHC 32.6 30.0 - 36.0 g/dL   RDW 19.1 47.8 - 29.5 %   Platelets 318 150 - 400 K/uL   nRBC 0.0 0.0 - 0.2 %   Neutrophils Relative % 90 %   Neutro Abs 17.1 (H) 1.7 - 7.7 K/uL   Lymphocytes Relative 5 %   Lymphs Abs 0.9 0.7 - 4.0 K/uL   Monocytes Relative 4 %   Monocytes Absolute 0.7 0.1 - 1.0  K/uL   Eosinophils Relative 0 %   Eosinophils Absolute 0.0 0.0 - 0.5 K/uL   Basophils Relative 0 %   Basophils Absolute 0.1 0.0 - 0.1 K/uL   Immature Granulocytes 1 %   Abs Immature Granulocytes  0.25 (H) 0.00 - 0.07 K/uL    Comment: Performed at [facility], [address] [place], [place] [address]  CK     Status: Abnormal   Collection Time: 03/05/19  6:20 PM  Result Value Ref Range   Total CK 2,155 (H) 49 - 397 U/L    Comment: Performed at [facility], [address] [place], [place] [address]  APTT     Status: None   Collection Time: 03/05/19  6:20 PM  Result Value Ref Range   aPTT 34 24 - 36 seconds    Comment: Performed at [facility], [address] [place], [place] [address]  Protime-INR     Status: Abnormal   Collection Time: 03/05/19  6:20 PM  Result Value Ref Range   Prothrombin Time 16.2 (H) 11.4 - 15.2 seconds   INR 1.3 (H) 0.8 - 1.2    Comment: (NOTE) INR goal varies based on device and disease states. Performed at [facility], [address], [place], [place] [address]   Procalcitonin     Status: None   Collection Time: 03/05/19  6:20 PM  Result Value Ref Range   Procalcitonin >150.00 ng/mL    Comment:        Interpretation: PCT >= 10 ng/mL: Important systemic inflammatory response, almost exclusively due to severe bacterial sepsis or septic shock. REPEATED TO VERIFY (NOTE)       Sepsis PCT Algorithm           Lower Respiratory Tract                                      Infection PCT Algorithm    ----------------------------     ----------------------------         PCT < 0.25 ng/mL                PCT < 0.10 ng/mL         Strongly encourage             Strongly discourage   discontinuation of antibiotics    initiation of antibiotics    ----------------------------     -----------------------------       PCT 0.25 - 0.50 ng/mL            PCT 0.10 - 0.25 ng/mL               OR       >80% decrease in PCT            Discourage initiation of                                            antibiotics      Encourage discontinuation           of antibiotics     ----------------------------     -----------------------------         PCT >= 0.50 ng/mL              PCT 0.26 -  0.50  ng/mL               AND       <80% decrease in PCT             Encourage initiation of                                             antibiotics       Encourage continuation           of antibiotics    ----------------------------     -----------------------------        PCT >= 0.50 ng/mL                  PCT > 0.50 ng/mL               AND         increase in PCT                  Strongly encourage                                      initiation of antibiotics    Strongly encourage escalation           of antibiotics                                     -----------------------------                                           PCT <= 0.25 ng/mL                                                 OR                                        > 80% decrease in PCT                                     Discontinue / Do not initiate                                             antibiotics Performed at [facility], [address] [place], [place] [id number]   Urinalysis, Routine w reflex microscopic     Status: Abnormal   Collection Time: 03/05/19  8:31 PM  Result Value Ref Range   Color, Urine AMBER (A) YELLOW    Comment: BIOCHEMICALS MAY BE AFFECTED BY COLOR   APPearance CLOUDY (A) CLEAR   Specific Gravity, Urine 1.012 1.005 - 1.030   pH 6.0 5.0 - 8.0   Glucose, UA NEGATIVE NEGATIVE mg/dL  Hgb urine dipstick LARGE (A) NEGATIVE   Bilirubin Urine NEGATIVE NEGATIVE   Ketones, ur 5 (A) NEGATIVE mg/dL   Protein, ur 100 (A) NEGATIVE mg/dL   Nitrite NEGATIVE NEGATIVE   Leukocytes,Ua LARGE (A) NEGATIVE   RBC / HPF 11-20 0 - 5 RBC/hpf   WBC, UA >50 (H) 0 - 5 WBC/hpf   Bacteria, UA MANY (A) NONE SEEN   Squamous Epithelial / LPF 0-5 0 - 5   WBC Clumps PRESENT    Mucus PRESENT     Comment: Performed at [facility], [place] [address]  [place], [place] [address]  Lactic acid, plasma     Status: Abnormal   Collection Time: 03/05/19  8:31 PM  Result Value Ref Range   Lactic Acid, Venous 2.0 (HH) 0.5 - 1.9 mmol/L    Comment: CRITICAL RESULT CALLED TO, READ BACK BY AND VERIFIED WITH: [facility] AT 2150 ON 03/05/19 BY A,MOHAMED Performed at [place] [address] [place], [place] [address]   Creatinine, urine, random     Status: None   Collection Time: 03/05/19  8:32 PM  Result Value Ref Range   Creatinine, Urine 74.14 mg/dL    Comment: Performed at [facility], [place] [address], [place], [place] [address]  Sodium, urine, random     Status: None   Collection Time: 03/05/19  8:32 PM  Result Value Ref Range   Sodium, Ur 52 mmol/L    Comment: Performed at [facility], [place] [address] [place], [place] [address]  Hepatic function panel     Status: Abnormal   Collection Time: 03/05/19  8:33 PM  Result Value Ref Range   Total Protein 7.8 6.5 - 8.1 g/dL   Albumin 2.6 (L) 3.5 - 5.0 g/dL   AST 102 (H) 15 - 41 U/L   ALT 72 (H) 0 - 44 U/L   Alkaline Phosphatase 218 (H) 38 - 126 U/L   Total Bilirubin 2.9 (H) 0.3 - 1.2 mg/dL   Bilirubin, Direct 1.5 (H) 0.0 - 0.2 mg/dL   Indirect Bilirubin 1.4 (H) 0.3 - 0.9 mg/dL    Comment: Performed at [facility], [place] [address], [place], [place] [address]  Troponin I (High Sensitivity)     Status: None   Collection Time: 03/05/19  8:33 PM  Result Value Ref Range   Troponin I (High Sensitivity) 15 <18 ng/L    Comment: (NOTE) Elevated high sensitivity troponin I (hsTnI) values and significant  changes across serial measurements may suggest ACS but many other  chronic and acute conditions are known to elevate hsTnI results.  Refer to the "Links" section for chest pain algorithms and additional  guidance. Performed at [facility], [place] [address] [place], [place] [address]   Creatinine, serum      Status: Abnormal   Collection Time: 03/05/19  8:33 PM  Result Value Ref Range   Creatinine, Ser 6.67 (H) 0.61 - 1.24 mg/dL   GFR calc non Af Amer 8 (L) >60 mL/min   GFR calc Af Amer 10 (L) >60 mL/min    Comment: Performed at [facility], [address] [place], [place] [address]    Chemistries  Recent Labs  Lab 03/05/19 1820 03/05/19 2033  NA 142  --   K 5.5*  --   CL 105  --   CO2 12*  --   GLUCOSE 83  --   BUN 82*  --  CREATININE 6.82* 6.67*  CALCIUM 8.2*  --   AST  --  102*  ALT  --  72*  ALKPHOS  --  218*  BILITOT  --  2.9*   ------------------------------------------------------------------------------------------------------------------  ------------------------------------------------------------------------------------------------------------------ GFR: Estimated Creatinine Clearance: 16.2 mL/min (A) (by C-G formula based on SCr of 6.67 mg/dL (H)). Liver Function Tests: Recent Labs  Lab 03/05/19 2033  AST 102*  ALT 72*  ALKPHOS 218*  BILITOT 2.9*  PROT 7.8  ALBUMIN 2.6*   No results for input(s): LIPASE, AMYLASE in the last 168 hours. No results for input(s): AMMONIA in the last 168 hours. Coagulation Profile: Recent Labs  Lab 03/05/19 1820  INR 1.3*   Cardiac Enzymes: Recent Labs  Lab 03/05/19 1820  CKTOTAL 2,155*   BNP (last 3 results) No results for input(s): PROBNP in the last 8760 hours. HbA1C: No results for input(s): HGBA1C in the last 72 hours. CBG: No results for input(s): GLUCAP in the last 168 hours. Lipid Profile: No results for input(s): CHOL, HDL, LDLCALC, TRIG, CHOLHDL, LDLDIRECT in the last 72 hours. Thyroid Function Tests: No results for input(s): TSH, T4TOTAL, FREET4, T3FREE, THYROIDAB in the last 72 hours. Anemia Panel: No results for input(s): VITAMINB12, FOLATE, FERRITIN, TIBC, IRON, RETICCTPCT in the last 72 hours.   --------------------------------------------------------------------------------------------------------------- Urine analysis:    Component Value Date/Time   COLORURINE AMBER (A) 03/05/2019 2031   APPEARANCEUR CLOUDY (A) 03/05/2019 2031   LABSPEC 1.012 03/05/2019 2031   PHURINE 6.0 03/05/2019 2031   GLUCOSEU NEGATIVE 03/05/2019 2031   HGBUR LARGE (A) 03/05/2019 2031   BILIRUBINUR NEGATIVE 03/05/2019 2031   KETONESUR 5 (A) 03/05/2019 2031   PROTEINUR 100 (A) 03/05/2019 2031   NITRITE NEGATIVE 03/05/2019 2031   LEUKOCYTESUR LARGE (A) 03/05/2019 2031      Imaging Results:    Dg Chest Port 1 View  Result Date: 03/05/2019 CLINICAL DATA:  Sepsis EXAM: PORTABLE CHEST 1 VIEW COMPARISON:  None. FINDINGS: The heart size and mediastinal contours are within normal limits. Both lungs are clear. The visualized skeletal structures are unremarkable. IMPRESSION: No acute abnormality of the lungs in AP portable projection. Electronically Signed   By: Lauralyn Primes M.D.   On: 03/05/2019 19:44       Assessment & Plan:    Principal Problem:   ARF (acute renal failure) (HCC) Active Problems:   Rhabdomyolysis   Hyperkalemia   Acute lower UTI  ARF secondary to rhabdo ? Check renal ultrasound Urine sodium, urine creatinine, urine protein , urine eosinophils Check spep, immunofixation Hydrate with ns at per hour, Pt states still making urine Check cmp in am Please consult nephrology in am  Rhabdomyolysis Hydrate with ns iv Check cmp in am  Hyperkalemia Sodium bicarb 1 amp iv x1 calclium gluconate 1gm iv x1 Check cmp in am  Sepsis (hypothermia, wbc elevation, lactic acid elevation) secondary to Acute lower uti Urine culture vanco iv given in ED,  Cont Cefepime iv pharmacy to dose  Abnormal liver function Check acute hepatitis panel Check RUQ ultrasound Check cmp in am  Arthritis pain Please refer to rheumatology,  Dr Kathi Ludwig, upon discharge call [phone] for appointment    DVT Prophylaxis-  Heparin- SCDs   AM Labs Ordered, also please review Full Orders  Family Communication: Admission, patients condition and plan of care including tests being ordered have been discussed with the patient  who indicate understanding and agree with the plan and Code Status.  Code Status:  FULL CODE per patient, attempted to contact  mother at [phone], number disconnected  Admission status:  Inpatient: Based on patients clinical presentation and evaluation of above clinical data, I have made determination that patient meets Inpatient criteria at this time.  Pt has critically high creatinine >6., pt will require iv fluids and close monitoring, has high risk of deterioration. Pt will require > 2nites stay.   Time spent in minutes : 70   Pearson GrippeJames Shaquisha Wynn M.D on 03/05/2019 at 10:24 PM

## 2019-03-05 NOTE — ED Notes (Addendum)
Date and time results received: 03/05/19 2149 (use smartphrase ".now" to insert current time)  Test:Lactic acid Critical Value: 2.0  Name of Provider Notified: kim james  Orders Received? Or Actions Taken?: orders placed

## 2019-03-05 NOTE — ED Triage Notes (Signed)
Per EMS, patient lives at [facility] on [address]. Patient is wheelchair bound, fell out of chair two days ago and has been there since. Patient has wounds to buttocks, pubic region. Motel manager discovered patient today and called EMS.

## 2019-03-05 NOTE — ED Provider Notes (Signed)
[facility]-EMERGENCY DEPT Provider Note   CSN: [id number] Arrival date & time: 03/05/19  1658     History   Chief Complaint Chief Complaint  Patient presents with  . Fall    HPI Jacob Garza is a 63 y.o. male history of chronic pain, he is wheelchair-bound due to pain and atrophy of the legs, presenting to the emergency department with fall.  Patient reports he was in the hotel room that he lives in, attempting to the bathroom, and he fell out of his wheelchair.  He reports he was unable to get back into his wheelchair and spent approximately 2 days on the floor of his bathroom.  Hotel management found him on the floor and was able to call the paramedics.  Patient denies any new symptoms, he denies striking his head or loss of consciousness, denies use of blood thinners.  He reports he has generalized pain in his back and his legs which is chronic.     HPI  Past Medical History:  Diagnosis Date  . Chronic pain   . Neuropathy     Patient Active Problem List   Diagnosis Date Noted  . Rhabdomyolysis 03/05/2019  . ARF (acute renal failure) (HCC) 03/05/2019  . Hyperkalemia 03/05/2019  . Acute lower UTI 03/05/2019    History reviewed. No pertinent surgical history.      Home Medications    Prior to Admission medications   Medication Sig Start Date End Date Taking? Authorizing Provider  naproxen sodium (ANAPROX) 220 MG tablet Take 220 mg by mouth 2 (two) times daily as needed (pain).    Yes [provider]  acetaminophen (TYLENOL) 500 MG tablet Take 1 tablet (500 mg total) by mouth every 6 (six) hours as needed. Patient not taking: Reported on 03/05/2019 05/10/13   Lurene ShadowPhelps, Erin O, PA-C  amLODipine (NORVASC) 10 MG tablet Take 1 tablet (10 mg total) by mouth daily. Patient not taking: Reported on 03/05/2019 05/10/13   Lurene ShadowPhelps, Erin O, PA-C  naproxen (NAPROSYN) 375 MG tablet Take 1 tablet (375 mg total) by mouth 2 (two) times daily. Take with  food Patient not taking: Reported on 03/05/2019 08/07/16   Hayden RasmussenMabe, David, NP  traMADol (ULTRAM) 50 MG tablet Take 1 tablet (50 mg total) by mouth every 6 (six) hours as needed. Patient not taking: Reported on 03/05/2019 08/07/16   Hayden RasmussenMabe, David, NP    Family History History reviewed. No pertinent family history.  Social History Social History   Tobacco Use  . Smoking status: Never Smoker  . Smokeless tobacco: Never Used  Substance Use Topics  . Alcohol use: No  . Drug use: No     Allergies   Patient has no known allergies.   Review of Systems Review of Systems  Constitutional: Positive for chills. Negative for fever.  Respiratory: Negative for cough and shortness of breath.   Cardiovascular: Negative for chest pain and palpitations.  Gastrointestinal: Negative for abdominal pain and vomiting.  Genitourinary: Negative for dysuria and hematuria.  Musculoskeletal: Positive for arthralgias, back pain, myalgias and neck pain.  Skin: Positive for wound. Negative for rash.  Neurological: Positive for light-headedness. Negative for syncope.  All other systems reviewed and are negative.    Physical Exam Updated Vital Signs BP (!) 140/102   Pulse 82   Temp (!) 95.3 F (35.2 C) (Rectal)   Resp 19   Ht 6\' 5"  (1.956 m)   Wt 108.9 kg   SpO2 99%   BMI 28.46 kg/m  Physical Exam Vitals signs and nursing note reviewed.  Constitutional:      Appearance: He is well-developed.  HENT:     Head: Normocephalic and atraumatic.  Eyes:     Conjunctiva/sclera: Conjunctivae normal.  Neck:     Musculoskeletal: Neck supple.  Cardiovascular:     Rate and Rhythm: Normal rate and regular rhythm.     Pulses: Normal pulses.  Pulmonary:     Effort: Pulmonary effort is normal. No respiratory distress.     Breath sounds: Normal breath sounds.  Abdominal:     Palpations: Abdomen is soft.     Tenderness: There is no abdominal tenderness.  Skin:    General: Skin is warm and dry.     Comments:  Diffuse stage 2 pressure ulcers on backside and buttocks, no tunneling wounds visible  Neurological:     Mental Status: He is alert and oriented to person, place, and time.     Comments: Minimal strength in bilateral lower extremities (at baseline per patient)      ED Treatments / Results  Labs (all labs ordered are listed, but only abnormal results are displayed) Labs Reviewed  BASIC METABOLIC PANEL - Abnormal; Notable for the following components:      Result Value   Potassium 5.5 (*)    CO2 12 (*)    BUN 82 (*)    Creatinine, Ser 6.82 (*)    Calcium 8.2 (*)    GFR calc non Af Amer 8 (*)    GFR calc Af Amer 9 (*)    Anion gap 25 (*)    All other components within normal limits  CBC WITH DIFFERENTIAL/PLATELET - Abnormal; Notable for the following components:   WBC 19.0 (*)    Neutro Abs 17.1 (*)    Abs Immature Granulocytes 0.25 (*)    All other components within normal limits  CK - Abnormal; Notable for the following components:   Total CK 2,155 (*)    All other components within normal limits  URINALYSIS, ROUTINE W REFLEX MICROSCOPIC - Abnormal; Notable for the following components:   Color, Urine AMBER (*)    APPearance CLOUDY (*)    Hgb urine dipstick LARGE (*)    Ketones, ur 5 (*)    Protein, ur 100 (*)    Leukocytes,Ua LARGE (*)    WBC, UA >50 (*)    Bacteria, UA MANY (*)    All other components within normal limits  LACTIC ACID, PLASMA - Abnormal; Notable for the following components:   Lactic Acid, Venous 2.0 (*)    All other components within normal limits  PROTIME-INR - Abnormal; Notable for the following components:   Prothrombin Time 16.2 (*)    INR 1.3 (*)    All other components within normal limits  HEPATIC FUNCTION PANEL - Abnormal; Notable for the following components:   Albumin 2.6 (*)    AST 102 (*)    ALT 72 (*)    Alkaline Phosphatase 218 (*)    Total Bilirubin 2.9 (*)    Bilirubin, Direct 1.5 (*)    Indirect Bilirubin 1.4 (*)    All other  components within normal limits  CREATININE, SERUM - Abnormal; Notable for the following components:   Creatinine, Ser 6.67 (*)    GFR calc non Af Amer 8 (*)    GFR calc Af Amer 10 (*)    All other components within normal limits  CULTURE, BLOOD (ROUTINE X 2)  CULTURE, BLOOD (ROUTINE X 2)  URINE CULTURE  APTT  PROCALCITONIN  CREATININE, URINE, RANDOM  SODIUM, URINE, RANDOM  LACTIC ACID, PLASMA  COMPREHENSIVE METABOLIC PANEL  CBC  CK TOTAL AND CKMB (NOT AT [facility])  HIV ANTIBODY (ROUTINE TESTING W REFLEX)  RAPID URINE DRUG SCREEN, HOSP PERFORMED  SALICYLATE LEVEL  TROPONIN I (HIGH SENSITIVITY)    EKG EKG Interpretation  Date/Time:  Sunday March 05 2019 17:34:16 EDT Ventricular Rate:  77 PR Interval:    QRS Duration: 125 QT Interval:  448 QTC Calculation: 514 R Axis:   36 Text Interpretation:  Sinus rhythm Atrial premature complex Consider left atrial enlargement Nonspecific intraventricular conduction delay Artifact in lead(s) V2 V3 No STEMI Prolonged QTc Confirmed by Alvester Chou [phone]) on 03/05/2019 6:28:49 PM   Radiology Dg Chest Port 1 View  Result Date: 03/05/2019 CLINICAL DATA:  Sepsis EXAM: PORTABLE CHEST 1 VIEW COMPARISON:  None. FINDINGS: The heart size and mediastinal contours are within normal limits. Both lungs are clear. The visualized skeletal structures are unremarkable. IMPRESSION: No acute abnormality of the lungs in AP portable projection. Electronically Signed   By: Lauralyn Primes M.D.   On: 03/05/2019 19:44    Procedures .Critical Care Performed by: Terald Sleeper, MD Authorized by: Terald Sleeper, MD   Critical care provider statement:    Critical care time (minutes):  45   Critical care was necessary to treat or prevent imminent or life-threatening deterioration of the following conditions:  Sepsis   Critical care was time spent personally by me on the following activities:  Discussions with consultants, evaluation of patient's response  to treatment, examination of patient, ordering and performing treatments and interventions, ordering and review of laboratory studies, ordering and review of radiographic studies, pulse oximetry, re-evaluation of patient's condition, obtaining history from patient or surrogate and review of old charts Comments:     IV fluids, IV antibiotics, sepsis workup   (including critical care time)  Medications Ordered in ED Medications  heparin injection 5,000 Units (has no administration in time range)  0.9 %  sodium chloride infusion (has no administration in time range)  calcium gluconate 1 g/ 50 mL sodium chloride IVPB (1,000 mg Intravenous New Bag/Given 03/05/19 2219)  vancomycin (VANCOCIN) IVPB 1000 mg/200 mL premix (1,000 mg Intravenous New Bag/Given 03/05/19 2209)  sodium chloride 0.9 % bolus 1,000 mL (0 mLs Intravenous Stopped 03/05/19 1904)  HYDROmorphone (DILAUDID) injection 0.5 mg (0.5 mg Intravenous Given 03/05/19 2018)  ceFEPIme (MAXIPIME) 2 g in sodium chloride 0.9 % 100 mL IVPB (2 g Intravenous New Bag/Given 03/05/19 2135)  sodium bicarbonate injection 50 mEq (50 mEq Intravenous Given 03/05/19 2219)     Initial Impression / Assessment and Plan / ED Course  I have reviewed the triage vital signs and the nursing notes.  Pertinent labs & imaging results that were available during my care of the patient were reviewed by me and considered in my medical decision making (see chart for details).  63 year old gentleman with a history chronic deconditioning and chronic pain syndrome, who is wheelchair-bound, presenting emergency department by EMS after being found down in his hotel room.  Patient reports he had a mechanical fall out of his wheelchair approximately 2 days ago and has been unable to get back up off the floor.  He spent this time on the floor of his hotel room.  He was hypothermic on arrival but mentating well.  He reports he is chronic pain in his back and legs but denies any other  symptoms.  He does have extensive skin breakdown and wounds noted on the backside near the pressure points from his wheelchair.  He believes thes4e are being caused by his wheelchair.  EMS providers reported that his "urine smelled really bad"  There is no signs or symptoms of head trauma and no report of head trauma.  He is not on blood thinners.  He has no headache.  I do not believe he needs stat imaging of his brain.  Plan to check a CK, BMP, CBC, UA, glucose Will give IV fluids here. Although he is hypothermic on arrival, he is not tachycardic or confused, I have no reason to suspect sepsis on initial arrival.  We will see what his labs show and reassess.   Clinical Course as of Mar 04 2230  Wynelle LinkSun Mar 05, 2019  [id number] Patient is hypothermic on arrival, but otherwise mentating well in conversation with me.  I suspect this is very likely environmental given he spent 2 days on his bare bathroom floor.  I have a lower suspicion for sepsis at this time.  We will continue to monitor.   [MT]  1923 With his unexplained leukocytosis and neutrophilia, I broadened the patient to a sepsis alert.  Will obtain remainder of labs and check a lactate.  We will look for localizing source.   [MT]  2000 BUN(!): 82 [MT]  2000 Creatinine(!): 6.82 [MT]  2000 Given his degree of renal dysfunction, will use up on the fluids.  Will give a liter at this time.  I was able to roll the patient to examine his wounds, he has diffuse stage II decubitus ulcers on his backside at his pressure points, there is feces in the buttocks which was clean.  There were no tunneling ulcers visible on my exam.   [MT]  2126 Talk to hospitalist Dr. Selena BattenKim who agrees patient [facility] and is recommending a stepdown unit.  No indication for urgent dialysis at this time.  Patient is not volume overloaded, is able to urinate, potassium is only 5.5.  Suspect he is significantly dehydrated.   [MT]    Clinical Course User Index  [MT] Nikcole Eischeid, Kermit BaloMatthew J, MD     Final Clinical Impressions(s) / ED Diagnoses   Final diagnoses:  ARF (acute renal failure) [facility](HCC)    ED Discharge Orders    None       Terald Sleeperrifan, Thelia Tanksley J, MD 03/05/19 2232

## 2019-03-06 DIAGNOSIS — A419 Sepsis, unspecified organism: Secondary | ICD-10-CM

## 2019-03-06 LAB — BASIC METABOLIC PANEL
Anion gap: 16 — ABNORMAL HIGH (ref 5–15)
BUN: 134 mg/dL — ABNORMAL HIGH (ref 6–20)
CO2: 18 mmol/L — ABNORMAL LOW (ref 22–32)
Calcium: 8.1 mg/dL — ABNORMAL LOW (ref 8.9–10.3)
Chloride: 110 mmol/L (ref 98–111)
Creatinine, Ser: 5.67 mg/dL — ABNORMAL HIGH (ref 0.61–1.24)
GFR calc Af Amer: 12 mL/min — ABNORMAL LOW (ref >60)
GFR calc non Af Amer: 10 mL/min — ABNORMAL LOW (ref >60)
Glucose, Bld: 114 mg/dL — ABNORMAL HIGH (ref 70–99)
Potassium: 5.2 mmol/L — ABNORMAL HIGH (ref 3.5–5.1)
Sodium: 144 mmol/L (ref 135–145)

## 2019-03-06 LAB — COMPREHENSIVE METABOLIC PANEL
ALT: 66 U/L — ABNORMAL HIGH (ref 0–44)
AST: 77 U/L — ABNORMAL HIGH (ref 15–41)
Albumin: 2.6 g/dL — ABNORMAL LOW (ref 3.5–5.0)
Alkaline Phosphatase: 207 U/L — ABNORMAL HIGH (ref 38–126)
Anion gap: 22 — ABNORMAL HIGH (ref 5–15)
BUN: 137 mg/dL — ABNORMAL HIGH (ref 6–20)
CO2: 16 mmol/L — ABNORMAL LOW (ref 22–32)
Calcium: 8.3 mg/dL — ABNORMAL LOW (ref 8.9–10.3)
Chloride: 108 mmol/L (ref 98–111)
Creatinine, Ser: 6.19 mg/dL — ABNORMAL HIGH (ref 0.61–1.24)
GFR calc Af Amer: 10 mL/min — ABNORMAL LOW (ref >60)
GFR calc non Af Amer: 9 mL/min — ABNORMAL LOW (ref >60)
Glucose, Bld: 105 mg/dL — ABNORMAL HIGH (ref 70–99)
Potassium: 5 mmol/L (ref 3.5–5.1)
Sodium: 146 mmol/L — ABNORMAL HIGH (ref 135–145)
Total Bilirubin: 2 mg/dL — ABNORMAL HIGH (ref 0.3–1.2)
Total Protein: 7.5 g/dL (ref 6.5–8.1)

## 2019-03-06 LAB — CK TOTAL AND CKMB (NOT AT ARMC)
CK, MB: 55.1 ng/mL — ABNORMAL HIGH (ref 0.5–5.0)
Relative Index: 4.8 — ABNORMAL HIGH (ref 0.0–2.5)
Total CK: 1159 U/L — ABNORMAL HIGH (ref 49–397)

## 2019-03-06 LAB — CBC
HCT: 45.8 % (ref 39.0–52.0)
Hemoglobin: 15 g/dL (ref 13.0–17.0)
MCH: 31.9 pg (ref 26.0–34.0)
MCHC: 32.8 g/dL (ref 30.0–36.0)
MCV: 97.4 fL (ref 80.0–100.0)
Platelets: 275 10*3/uL (ref 150–400)
RBC: 4.7 MIL/uL (ref 4.22–5.81)
RDW: 14.8 % (ref 11.5–15.5)
WBC: 16.1 10*3/uL — ABNORMAL HIGH (ref 4.0–10.5)
nRBC: 0 % (ref 0.0–0.2)

## 2019-03-06 LAB — RAPID URINE DRUG SCREEN, HOSP PERFORMED
Amphetamines: NOT DETECTED
Barbiturates: NOT DETECTED
Benzodiazepines: NOT DETECTED
Cocaine: NOT DETECTED
Opiates: NOT DETECTED
Tetrahydrocannabinol: NOT DETECTED

## 2019-03-06 LAB — NA AND K (SODIUM & POTASSIUM), RAND UR
Potassium Urine: 24 mmol/L
Sodium, Ur: 44 mmol/L

## 2019-03-06 LAB — SARS CORONAVIRUS 2 BY RT PCR (HOSPITAL ORDER, PERFORMED IN ~~LOC~~ HOSPITAL LAB): SARS Coronavirus 2: NEGATIVE

## 2019-03-06 LAB — OSMOLALITY, URINE: Osmolality, Ur: 416 mOsm/kg (ref 300–900)

## 2019-03-06 LAB — HIV ANTIBODY (ROUTINE TESTING W REFLEX): HIV Screen 4th Generation wRfx: NONREACTIVE

## 2019-03-06 LAB — PROTEIN / CREATININE RATIO, URINE
Creatinine, Urine: 68.95 mg/dL
Protein Creatinine Ratio: 1.62 mg/mg{Cre} — ABNORMAL HIGH (ref 0.00–0.15)
Total Protein, Urine: 112 mg/dL

## 2019-03-06 MED ORDER — HYDROCODONE-ACETAMINOPHEN 5-325 MG PO TABS
1.0000 | ORAL_TABLET | Freq: Four times a day (QID) | ORAL | Status: DC | PRN
  Administered 2019-03-06 – 2019-03-09 (×8): 2 via ORAL
  Filled 2019-03-06 (×8): qty 2

## 2019-03-06 MED ORDER — TRAMADOL HCL 50 MG PO TABS
50.0000 mg | ORAL_TABLET | Freq: Two times a day (BID) | ORAL | Status: DC | PRN
  Administered 2019-03-06 – 2019-03-09 (×2): 50 mg via ORAL
  Filled 2019-03-06 (×2): qty 1

## 2019-03-06 MED ORDER — SODIUM CHLORIDE 0.9 % IV SOLN
2.0000 g | INTRAVENOUS | Status: DC
  Administered 2019-03-06 – 2019-03-08 (×3): 2 g via INTRAVENOUS
  Filled 2019-03-06 (×3): qty 2

## 2019-03-06 MED ORDER — TRAMADOL HCL 50 MG PO TABS: 50.0000 mg | ORAL_TABLET | Freq: Four times a day (QID) | ORAL | Status: DC | PRN

## 2019-03-06 MED ORDER — SODIUM BICARBONATE 8.4 % IV SOLN
INTRAVENOUS | Status: DC
  Administered 2019-03-07: 02:00:00 via INTRAVENOUS
  Filled 2019-03-06 (×4): qty 100

## 2019-03-06 MED ORDER — AMLODIPINE BESYLATE 10 MG PO TABS
10.0000 mg | ORAL_TABLET | Freq: Every day | ORAL | Status: DC
  Administered 2019-03-06 – 2019-03-09 (×4): 10 mg via ORAL
  Filled 2019-03-06: qty 2
  Filled 2019-03-06 (×3): qty 1

## 2019-03-06 NOTE — ED Notes (Signed)
hospitalist at bedside

## 2019-03-06 NOTE — Progress Notes (Signed)
Pharmacy Antibiotic Note  Jacob Garza is a 63 y.o. male wheelchair bound found in floor after 2 days admitted on 03/05/2019 with sepsis.  Pharmacy has been consulted for cefepime dosing.  Plan: Cefepime 2 Gm IV q24h F/u scr/cultures/levels  Height: 6\' 5"  (195.6 cm) Weight: 240 lb (108.9 kg) IBW/kg (Calculated) : 89.1  Temp (24hrs), Avg:94.9 F (34.9 C), Min:94.4 F (34.7 C), Max:95.3 F (35.2 C)  Recent Labs  Lab 03/05/19 1820 03/05/19 2031 03/05/19 2033  WBC 19.0*  --   --   CREATININE 6.82*  --  6.67*  LATICACIDVEN  --  2.0*  --     Estimated Creatinine Clearance: 16.2 mL/min (A) (by C-G formula based on SCr of 6.67 mg/dL (H)).    No Known Allergies  Antimicrobials this admission: 9/21 cefepime >>  9/21 vancmycin >> x1 ED 9/21 flagyl >> x1 ED  Dose adjustments this admission:   Microbiology results:  BCx:   UCx:    Sputum:    MRSA PCR:   Thank you for allowing pharmacy to be a part of this patient's care.  Dorrene German 03/06/2019 12:51 AM

## 2019-03-06 NOTE — ED Notes (Signed)
Received call from radiology r/t imaging order hepato w/eject fract. Pt has to be NPO x 6 hours prior to study. Pt to be NPO after midnight to have study in the morning.

## 2019-03-06 NOTE — Progress Notes (Signed)
Called RN to request repeat lactate to collected by lab for sepsis tracking.

## 2019-03-06 NOTE — TOC Initial Note (Addendum)
Transition of Care [facility]) - Initial/Assessment Note    Patient Details  Name: Jacob Garza MRN: [id number] Date of Birth: 05/20/1956  [facility] CM/SW Contact:    Erenest Rasher, RN Phone Number: [phone] 03/06/2019, 1:32 PM  Clinical Narrative:                 Spoke to pt and states he has been living at the [facility] for over 10 years. Currently living at the [facility] on [address]. He has Wheelchair that he uses to get up to the bathroom. States he has not walked in over 10 years. Agreeable to SNF for rehab to get stronger prior to going back. Pt consented to [facility] for housing. Pt uses Medicaid transportation or takes a cab.   Expected Discharge Plan: Skilled Nursing Facility Barriers to Discharge: Continued Medical Work up   Patient Goals and CMS Choice Patient states their goals for this hospitalization and ongoing recovery are:: want to be able to care for myself at home CMS Medicare.gov Compare Post Acute Care list provided to:: Patient Choice offered to / list presented to : Patient  Expected Discharge Plan and Services Expected Discharge Plan: [place] In-house Referral: Clinical Social Work Discharge Planning Services: CM Consult   Living arrangements for the past 2 months: Hotel/Motel([facility] on [address])                                      Prior Living Arrangements/Services Living arrangements for the past 2 months: Hotel/Motel([facility] on [address]) Lives with:: Self Patient language and need for interpreter reviewed:: Yes        Need for Family Participation in Patient Care: Yes (Comment) Care giver support system in place?: Yes (comment) Current home services: DME(wheelchair, walker) Criminal Activity/Legal Involvement Pertinent to Current Situation/Hospitalization: No - Comment as needed  Activities of Daily Living      Permission Sought/Granted Permission sought to share information with : Case Manager,  PCP Permission granted to share information with : Yes, Verbal Permission Granted              Emotional Assessment Appearance:: Appears stated age Attitude/Demeanor/Rapport: Engaged, Gracious Affect (typically observed): Accepting Orientation: : Oriented to Self, Oriented to Place, Oriented to  Time, Oriented to Situation   Psych Involvement: No (comment)  Admission diagnosis:  Fall, Sore, No Appetite  Patient Active Problem List   Diagnosis Date Noted  . Rhabdomyolysis 03/05/2019  . ARF (acute renal failure) ([place]) 03/05/2019  . Hyperkalemia 03/05/2019  . Acute lower UTI 03/05/2019  . Sepsis ([place]) 03/05/2019   PCP:  Patient, No Pcp Per Pharmacy:   [facility], [place] [address] [place] [address] Phone: [phone] Fax: [phone]  [id number] - [place], [place] - [address] [place] [address] Phone: [phone] Fax: [phone]     Social Determinants of Health (SDOH) Interventions    Readmission Risk Interventions No flowsheet data found.

## 2019-03-06 NOTE — Progress Notes (Signed)
PROGRESS NOTE    Jacob Garza  QQV:[id number] DOB: 1955/11/28 DOA: 03/05/2019 PCP: Patient, No Pcp Per   Brief Narrative: Jacob Garza is a 63 y.o. male with a history of chronic pain. Patient presented secondary to being found down after fall from wheelchair. Found to meet sepsis criteria with presumed UTI in addition to rhabdomyolysis and significant kidney injury. Unknown baseline.   Assessment & Plan:   Principal Problem:   ARF (acute renal failure) (HCC) Active Problems:   Rhabdomyolysis   Hyperkalemia   Acute lower UTI   Sepsis (HCC)   Acute renal failure Unknown etiology. Patient does take NSAIDs for chronic arthritis. Possibly secondary to rhabdomyolysis. No known baseline. Renal ultrasound without hydronephrosis. BUN significantly worsened this morning -Repeat BMP -Nephrology consult -Continue IV fluids per nephrology recommendations -Discontinue NSAIDs -Bladder scan  Rhabdomyolysis IV fluids as mentioned above. CK improved from yesterday  Sepsis Present on admission. Physiology improved today. WBC trended down slightly. Presumed UTI form urinalysis. No symptoms. Procalcitonin >150. Received one dose of vancomycin in the ED -Continue Cefepime -Blood/urine cultures pending  Hypernatremia Mild. In setting of   Abnormal LFTs Likely related to rhabdomyolysis. RUQ ultrasound significant for likely "stone filled gallbladder." No associated abdominal pain. -CMP in AM -Will obtain HIDA scan  Essential hypertension Poorly controlled -Restart home amlodipine  Arthritis Chronic. Patient has been in a wheelchair for many years secondary to his arthritis -Restart home Tramadol  Complicated cyst Seen on renal ultrasound. Left kidney. Will need outpatient follow-up   DVT prophylaxis: Heparin subq Code Status:   Code Status: Full Code Family Communication: None at bedside Disposition Plan: Discharge back to ALF pending continued workup   Consultants:    Nephrology  Procedures:   None  Antimicrobials:  Vancomycin (9/20)  Cefepime (9/21>>    Subjective: Complaining about previous admission when he did not get pain medication. Stated that yesterday, he had urgency to have a BM and could not navigate his wheelchair into the bathroom and unfortunately had a BM in the wheelchair seat. When he attempted to reach for something in front of his wheelchair, he unfortunately fell.  Objective: Vitals:   03/06/19 0730 03/06/19 0800 03/06/19 0830 03/06/19 0845  BP: (!) 157/111 133/82 (!) 155/110 (!) 159/101  Pulse: 91 96 67 80  Resp: 17 19 16 17   Temp:      TempSrc:      SpO2: 100% 100% 100% 99%  Weight:      Height:        Intake/Output Summary (Last 24 hours) at 03/06/2019 0942 Last data filed at 03/05/2019 2310 Gross per 24 hour  Intake 550 ml  Output -  Net 550 ml   Filed Weights   03/05/19 1723  Weight: 108.9 kg    Examination:  General exam: Appears calm and comfortable Respiratory system: Clear to auscultation. Respiratory effort normal. Cardiovascular system: S1 & S2 heard, RRR. No murmurs, rubs, gallops or clicks. Gastrointestinal system: Abdomen is nondistended, soft and nontender. No organomegaly or masses felt. Normal bowel sounds heard. Central nervous system: Alert and oriented. No focal neurological deficits. Extremities: No edema. No calf tenderness Skin: No cyanosis. No rashes Psychiatry: Judgement and insight appear normal. Mood & affect appropriate.     Data Reviewed: I have personally reviewed following labs and imaging studies  CBC: Recent Labs  Lab 03/05/19 1820 03/06/19 0720  WBC 19.0* 16.1*  NEUTROABS 17.1*  --   HGB 15.0 15.0  HCT 46.0 45.8  MCV 96.6 97.4  PLT 318 275   Basic Metabolic Panel: Recent Labs  Lab 03/05/19 1820 03/05/19 2033 03/06/19 0720  NA 142  --  146*  K 5.5*  --  5.0  CL 105  --  108  CO2 12*  --  16*  GLUCOSE 83  --  105*  BUN 82*  --  137*  CREATININE 6.82*  6.67* 6.19*  CALCIUM 8.2*  --  8.3*   GFR: Estimated Creatinine Clearance: 17.4 mL/min (A) (by C-G formula based on SCr of 6.19 mg/dL (H)). Liver Function Tests: Recent Labs  Lab 03/05/19 2033 03/06/19 0720  AST 102* 77*  ALT 72* 66*  ALKPHOS 218* 207*  BILITOT 2.9* 2.0*  PROT 7.8 7.5  ALBUMIN 2.6* 2.6*   No results for input(s): LIPASE, AMYLASE in the last 168 hours. No results for input(s): AMMONIA in the last 168 hours. Coagulation Profile: Recent Labs  Lab 03/05/19 1820  INR 1.3*   Cardiac Enzymes: Recent Labs  Lab 03/05/19 1820  CKTOTAL 2,155*   BNP (last 3 results) No results for input(s): PROBNP in the last 8760 hours. HbA1C: No results for input(s): HGBA1C in the last 72 hours. CBG: No results for input(s): GLUCAP in the last 168 hours. Lipid Profile: No results for input(s): CHOL, HDL, LDLCALC, TRIG, CHOLHDL, LDLDIRECT in the last 72 hours. Thyroid Function Tests: No results for input(s): TSH, T4TOTAL, FREET4, T3FREE, THYROIDAB in the last 72 hours. Anemia Panel: No results for input(s): VITAMINB12, FOLATE, FERRITIN, TIBC, IRON, RETICCTPCT in the last 72 hours. Sepsis Labs: Recent Labs  Lab 03/05/19 1820 03/05/19 2031  PROCALCITON >150.00  --   LATICACIDVEN  --  2.0*    Recent Results (from the past 240 hour(s))  Blood Culture (routine x 2)     Status: None (Preliminary result)   Collection Time: 03/05/19  8:31 PM   Specimen: BLOOD  Result Value Ref Range Status   Specimen Description   Final    BLOOD RIGHT ANTECUBITAL Performed at [facility], [address], [place], [place] [address]    Special Requests   Final    BOTTLES DRAWN AEROBIC AND ANAEROBIC Blood Culture results may not be optimal due to an excessive volume of blood received in culture bottles Performed at [facility], [address], [place], [place] [address]    Culture   Final    NO GROWTH < 12 HOURS Performed at [facility],  [address] [place], [place] [address]    Report Status PENDING  Incomplete  Blood Culture (routine x 2)     Status: None (Preliminary result)   Collection Time: 03/05/19  8:32 PM   Specimen: BLOOD  Result Value Ref Range Status   Specimen Description   Final    BLOOD LEFT ANTECUBITAL Performed at [facility], [address], [place], [place] [address]    Special Requests   Final    BOTTLES DRAWN AEROBIC AND ANAEROBIC Blood Culture results may not be optimal due to an excessive volume of blood received in culture bottles Performed at [facility], [address], [place], [place] [address]    Culture   Final    NO GROWTH < 12 HOURS Performed at [facility], [address] [place], [place] [address]    Report Status PENDING  Incomplete         Radiology Studies: US Renal  Result Date: 03/05/2019 CLINICAL DATA:  Acute kidney injury  EXAM: RENAL / URINARY TRACT ULTRASOUND COMPLETE COMPARISON:  None. FINDINGS: Right Kidney: Renal measurements: 11.2 x 6.8 x 6.6 cm = volume: 239.3 mL . Echogenicity within normal limits. No mass or hydronephrosis visualized. Left Kidney: Renal measurements: 10.6 x 6.5 x 5.3 cm = volume: 189.6 mL. Cortical echogenicity normal. No hydronephrosis. Cyst containing scattered echoes off lower pole left kidney measuring 3.3 x 3.6 x 3.6 cm. Bladder: Appears normal for degree of bladder distention. IMPRESSION: 1. Negative for hydronephrosis. 2. Slightly complicated cyst inferior left kidney Electronically Signed   By: Donavan Foil M.D.   On: 03/05/2019 22:38   Dg Chest Port 1 View  Result Date: 03/05/2019 CLINICAL DATA:  Sepsis EXAM: PORTABLE CHEST 1 VIEW COMPARISON:  None. FINDINGS: The heart size and mediastinal contours are within normal limits. Both lungs are clear. The visualized skeletal structures are unremarkable. IMPRESSION: No acute abnormality of the lungs in AP portable projection. Electronically Signed   By:  Eddie Candle M.D.   On: 03/05/2019 19:44   US Abdomen Limited Ruq  Result Date: 03/05/2019 CLINICAL DATA:  Abnormal liver function test EXAM: ULTRASOUND ABDOMEN LIMITED RIGHT UPPER QUADRANT COMPARISON:  None. FINDINGS: Gallbladder: Diffuse shadowing within the gallbladder presumably due to stone filled gallbladder. Increased wall thickness 4 mm. Negative sonographic Murphy Common bile duct: Diameter: 3.8 mm Liver: Heterogeneous liver echotexture with areas of increased echogenicity. No discrete mass. Portal vein is patent on color Doppler imaging with normal direction of blood flow towards the liver. Other: None. IMPRESSION: 1. Diffuse shadowing within the gallbladder, presumably due to stone filled gallbladder. Nonspecific mild increased wall thickness, could be secondary to acute or chronic cholecystitis, or liver disease. Sonographic Percell Miller was negative. If further evaluation is required, consider correlation with nuclear medicine hepatobiliary imaging 2. Heterogeneous liver with areas of increased hepatic echogenicity, suspect that there may be heterogenous fat infiltration. Electronically Signed   By: Donavan Foil M.D.   On: 03/05/2019 23:31        Scheduled Meds: . heparin  5,000 Units Subcutaneous Q8H  . nystatin   Topical TID   Continuous Infusions: . sodium chloride 175 mL/hr (03/06/19 0733)  . ceFEPime (MAXIPIME) IV       LOS: 1 day     Cordelia Poche, MD [facility] 03/06/2019, 9:42 AM  If 7PM-7AM, please contact night-coverage [email or web address]

## 2019-03-06 NOTE — Progress Notes (Signed)
Paged [facility] admitting [place] md with this message.   1E07, [place], Please come and look at pt's weeping wounds and his contracted hot legs.  Pt's anterior has been cleansed as best as possible.  Cannot get to groin or posterior due to severe pain & gross abdominal swelling.

## 2019-03-06 NOTE — Progress Notes (Signed)
Pt arrived from [facility].  It is not charted that Pt received sodium bicarb, ordered for 13:15.    Pt unable to roll due to pain, cannot assess skin because skin is cracked and broken and coated with nystatin powder.  Skin is weeping clear fluid.   I text paged Gardenia Phlegm MD and asked him to call me. Randol Kern he returned call and asked me to call the oncoming md at 30.  I explained pt is in severe pain and cannot be assessed, very faint pedal pulses, doppler3ed, pitting edema and red hot bilateral lower extremities.  he repeated that I call oncoming md.

## 2019-03-06 NOTE — ED Notes (Signed)
[facility] called for transfer 

## 2019-03-06 NOTE — Consult Note (Signed)
[facility] Nephrology Consultation Note  Requesting MD: Dr Jacquelin Hawking Reason for consult: AKI  HPI:  Jacob Garza is a 63 y.o. male with history of chronic pain, neuropathy with wheelchair-bound who was presented to the hospital after being found down on the floor, consulted [place] by Dr. Caleb Popp for the evaluation of acute kidney injury.  Patient reported that he lives in a motel for more than 10 years and has no PCP follow-up.  He reports taking 4 to 5 tablets of Aleve every day for his chronic pain.  He was found down on the floor for about 2 days when the staff from the bottle noticed and brought to the hospital.  In the ER, patient was hemodynamically stable, on room air.  The labs showed creatinine level 6.82, BUN 82 which was increased to 137 today.  Potassium 5.5 and CO2 12.  Patient with elevated CK level of 2155, lactic acid 2, leukocytosis and UA positive for UTI.  Patient was a started on broad-spectrum antibiotics including cefepime and vancomycin.  Treated with normal saline IV fluid.  Ultrasound kidney normal echogenicity, no mass or hydronephrosis.  There is slight complicated cyst in left kidney. Today patient reports generalized body pain.  He denies nausea vomiting, chest pain or shortness of breath.  He already has 800 cc of urine output.  Creatinine, Ser  Date/Time Value Ref Range Status  03/06/2019 07:20 AM 6.19 (H) 0.61 - 1.24 mg/dL Final  81/19/1478 29:56 PM 6.67 (H) 0.61 - 1.24 mg/dL Final  21/30/8657 84:69 PM 6.82 (H) 0.61 - 1.24 mg/dL Final     PMHx:   Past Medical History:  Diagnosis Date  . Chronic pain   . Neuropathy     Surgical history reviewed. No pertinent surgical history.  Family Hx: History reviewed. No pertinent family history.  No known family history.  Social History:  reports that he has never smoked. He has never used smokeless tobacco. He reports that he does not drink alcohol or use drugs.  Allergies: No Known  Allergies  Medications: Prior to Admission medications   Medication Sig Start Date End Date Taking? Authorizing Provider  naproxen sodium (ANAPROX) 220 MG tablet Take 220 mg by mouth 2 (two) times daily as needed (pain).    Yes [provider]  acetaminophen (TYLENOL) 500 MG tablet Take 1 tablet (500 mg total) by mouth every 6 (six) hours as needed. Patient not taking: Reported on 03/05/2019 05/10/13   Lurene Shadow, PA-C  amLODipine (NORVASC) 10 MG tablet Take 1 tablet (10 mg total) by mouth daily. Patient not taking: Reported on 03/05/2019 05/10/13   Lurene Shadow, PA-C  naproxen (NAPROSYN) 375 MG tablet Take 1 tablet (375 mg total) by mouth 2 (two) times daily. Take with food Patient not taking: Reported on 03/05/2019 08/07/16   Hayden Rasmussen, NP  traMADol (ULTRAM) 50 MG tablet Take 1 tablet (50 mg total) by mouth every 6 (six) hours as needed. Patient not taking: Reported on 03/05/2019 08/07/16   Hayden Rasmussen, NP    I have reviewed the patient's current medications.  Labs:  Results for orders placed or performed during the hospital encounter of 03/05/19 (from the past 48 hour(s))  Basic metabolic panel     Status: Abnormal   Collection Time: 03/05/19  6:20 PM  Result Value Ref Range   Sodium 142 135 - 145 mmol/L   Potassium 5.5 (H) 3.5 - 5.1 mmol/L   Chloride 105 98 - 111 mmol/L  CO2 12 (L) 22 - 32 mmol/L   Glucose, Bld 83 70 - 99 mg/dL   BUN 82 (H) 6 - 20 mg/dL    Comment: RESULTS CONFIRMED BY MANUAL DILUTION   Creatinine, Ser 6.82 (H) 0.61 - 1.24 mg/dL   Calcium 8.2 (L) 8.9 - 10.3 mg/dL   GFR calc non Af Amer 8 (L) >60 mL/min   GFR calc Af Amer 9 (L) >60 mL/min   Anion gap 25 (H) 5 - 15    Comment: Performed at [facility], [address] [place], [place] [address]  CBC with Differential     Status: Abnormal   Collection Time: 03/05/19  6:20 PM  Result Value Ref Range   WBC 19.0 (H) 4.0 - 10.5 K/uL   RBC 4.76 4.22 - 5.81 MIL/uL   Hemoglobin 15.0  13.0 - 17.0 g/dL   HCT 40.9 81.1 - 91.4 %   MCV 96.6 80.0 - 100.0 fL   MCH 31.5 26.0 - 34.0 pg   MCHC 32.6 30.0 - 36.0 g/dL   RDW 78.2 95.6 - 21.3 %   Platelets 318 150 - 400 K/uL   nRBC 0.0 0.0 - 0.2 %   Neutrophils Relative % 90 %   Neutro Abs 17.1 (H) 1.7 - 7.7 K/uL   Lymphocytes Relative 5 %   Lymphs Abs 0.9 0.7 - 4.0 K/uL   Monocytes Relative 4 %   Monocytes Absolute 0.7 0.1 - 1.0 K/uL   Eosinophils Relative 0 %   Eosinophils Absolute 0.0 0.0 - 0.5 K/uL   Basophils Relative 0 %   Basophils Absolute 0.1 0.0 - 0.1 K/uL   Immature Granulocytes 1 %   Abs Immature Granulocytes 0.25 (H) 0.00 - 0.07 K/uL    Comment: Performed at [facility], [address] [place], [place] [address]  CK     Status: Abnormal   Collection Time: 03/05/19  6:20 PM  Result Value Ref Range   Total CK 2,155 (H) 49 - 397 U/L    Comment: Performed at [facility], [address], [place], [place] [address]  APTT     Status: None   Collection Time: 03/05/19  6:20 PM  Result Value Ref Range   aPTT 34 24 - 36 seconds    Comment: Performed at [facility], [address], [place], [place] [address]  Protime-INR     Status: Abnormal   Collection Time: 03/05/19  6:20 PM  Result Value Ref Range   Prothrombin Time 16.2 (H) 11.4 - 15.2 seconds   INR 1.3 (H) 0.8 - 1.2    Comment: (NOTE) INR goal varies based on device and disease states. Performed at [facility], [address] [place], [place] [address]   Procalcitonin     Status: None   Collection Time: 03/05/19  6:20 PM  Result Value Ref Range   Procalcitonin >150.00 ng/mL    Comment:        Interpretation: PCT >= 10 ng/mL: Important systemic inflammatory response, almost exclusively due to severe bacterial sepsis or septic shock. REPEATED TO VERIFY (NOTE)       Sepsis PCT Algorithm           Lower Respiratory Tract                                      Infection PCT  Algorithm    ----------------------------     ----------------------------  PCT < 0.25 ng/mL                PCT < 0.10 ng/mL         Strongly encourage             Strongly discourage   discontinuation of antibiotics    initiation of antibiotics    ----------------------------     -----------------------------       PCT 0.25 - 0.50 ng/mL            PCT 0.10 - 0.25 ng/mL               OR       >80% decrease in PCT            Discourage initiation of                                            antibiotics      Encourage discontinuation           of antibiotics    ----------------------------     -----------------------------         PCT >= 0.50 ng/mL              PCT 0.26 - 0.50  ng/mL               AND       <80% decrease in PCT             Encourage initiation of                                             antibiotics       Encourage continuation           of antibiotics    ----------------------------     -----------------------------        PCT >= 0.50 ng/mL                  PCT > 0.50 ng/mL               AND         increase in PCT                  Strongly encourage                                      initiation of antibiotics    Strongly encourage escalation           of antibiotics                                     -----------------------------                                           PCT <= 0.25 ng/mL  OR                                        > 80% decrease in PCT                                     Discontinue / Do not initiate                                             antibiotics Performed at [facility], [address], [place], [place] [address]   Urinalysis, Routine w reflex microscopic     Status: Abnormal   Collection Time: 03/05/19  8:31 PM  Result Value Ref Range   Color, Urine AMBER (A) YELLOW    Comment: BIOCHEMICALS MAY BE AFFECTED BY COLOR   APPearance CLOUDY (A) CLEAR    Specific Gravity, Urine 1.012 1.005 - 1.030   pH 6.0 5.0 - 8.0   Glucose, UA NEGATIVE NEGATIVE mg/dL   Hgb urine dipstick LARGE (A) NEGATIVE   Bilirubin Urine NEGATIVE NEGATIVE   Ketones, ur 5 (A) NEGATIVE mg/dL   Protein, ur 102 (A) NEGATIVE mg/dL   Nitrite NEGATIVE NEGATIVE   Leukocytes,Ua LARGE (A) NEGATIVE   RBC / HPF 11-20 0 - 5 RBC/hpf   WBC, UA >50 (H) 0 - 5 WBC/hpf   Bacteria, UA MANY (A) NONE SEEN   Squamous Epithelial / LPF 0-5 0 - 5   WBC Clumps PRESENT    Mucus PRESENT     Comment: Performed at [facility], [address], [place], [place] [address]  Lactic acid, plasma     Status: Abnormal   Collection Time: 03/05/19  8:31 PM  Result Value Ref Range   Lactic Acid, Venous 2.0 (HH) 0.5 - 1.9 mmol/L    Comment: CRITICAL RESULT CALLED TO, READ BACK BY AND VERIFIED WITH: [facility] AT 2150 ON 03/05/19 BY A,MOHAMED Performed at [facility], [address], [place], [place] [address]   Blood Culture (routine x 2)     Status: None (Preliminary result)   Collection Time: 03/05/19  8:31 PM   Specimen: BLOOD  Result Value Ref Range   Specimen Description      BLOOD RIGHT ANTECUBITAL Performed at [facility], [address], [place], [place] [address]    Special Requests      BOTTLES DRAWN AEROBIC AND ANAEROBIC Blood Culture results may not be optimal due to an excessive volume of blood received in culture bottles Performed at [facility], [address] [place], [place] [address]    Culture      NO GROWTH < 12 HOURS Performed at [facility], [address], [place], [place] [address]    Report Status PENDING   Urine rapid drug screen (hosp performed)     Status: None   Collection Time: 03/05/19  8:31 PM  Result Value Ref Range   Opiates NONE DETECTED NONE DETECTED   Cocaine NONE DETECTED NONE DETECTED   Benzodiazepines NONE DETECTED NONE DETECTED   Amphetamines NONE DETECTED NONE  DETECTED   Tetrahydrocannabinol NONE DETECTED NONE DETECTED   Barbiturates NONE DETECTED NONE DETECTED  Comment: (NOTE) DRUG SCREEN FOR MEDICAL PURPOSES ONLY.  IF CONFIRMATION IS NEEDED FOR ANY PURPOSE, NOTIFY LAB WITHIN 5 DAYS. LOWEST DETECTABLE LIMITS FOR URINE DRUG SCREEN Drug Class                     Cutoff (ng/mL) Amphetamine and metabolites    1000 Barbiturate and metabolites    200 Benzodiazepine                 200 Tricyclics and metabolites     300 Opiates and metabolites        300 Cocaine and metabolites        300 THC                            50 Performed at [facility], [address] [place], [place] [address]   Blood Culture (routine x 2)     Status: None (Preliminary result)   Collection Time: 03/05/19  8:32 PM   Specimen: BLOOD  Result Value Ref Range   Specimen Description      BLOOD LEFT ANTECUBITAL Performed at [facility], [address] [place], [place] [address]    Special Requests      BOTTLES DRAWN AEROBIC AND ANAEROBIC Blood Culture results may not be optimal due to an excessive volume of blood received in culture bottles Performed at [facility], [address] [place], [place] [address]    Culture      NO GROWTH < 12 HOURS Performed at [facility], [address] [place], [place] [address]    Report Status PENDING   Creatinine, urine, random     Status: None   Collection Time: 03/05/19  8:32 PM  Result Value Ref Range   Creatinine, Urine 74.14 mg/dL    Comment: Performed at [facility], [address] [place], [place] [address]  Sodium, urine, random     Status: None   Collection Time: 03/05/19  8:32 PM  Result Value Ref Range   Sodium, Ur 52 mmol/L    Comment: Performed at [facility], [address] [place], [place] [address]  Hepatic function panel     Status: Abnormal   Collection Time: 03/05/19  8:33 PM  Result Value  Ref Range   Total Protein 7.8 6.5 - 8.1 g/dL   Albumin 2.6 (L) 3.5 - 5.0 g/dL   AST 284102 (H) 15 - 41 U/L   ALT 72 (H) 0 - 44 U/L   Alkaline Phosphatase 218 (H) 38 - 126 U/L   Total Bilirubin 2.9 (H) 0.3 - 1.2 mg/dL   Bilirubin, Direct 1.5 (H) 0.0 - 0.2 mg/dL   Indirect Bilirubin 1.4 (H) 0.3 - 0.9 mg/dL    Comment: Performed at [facility], [address] [place], [place] [address]  Troponin I (High Sensitivity)     Status: None   Collection Time: 03/05/19  8:33 PM  Result Value Ref Range   Troponin I (High Sensitivity) 15 <18 ng/L    Comment: (NOTE) Elevated high sensitivity troponin I (hsTnI) values and significant  changes across serial measurements may suggest ACS but many other  chronic and acute conditions are known to elevate hsTnI results.  Refer to the "Links" section for chest pain algorithms and additional  guidance. Performed at [facility], [address] [place], [place] [address]  Creatinine, serum     Status: Abnormal   Collection Time: 03/05/19  8:33 PM  Result Value Ref Range   Creatinine, Ser 6.67 (H) 0.61 - 1.24 mg/dL   GFR calc non Af Amer 8 (L) >60 mL/min   GFR calc Af Amer 10 (L) >60 mL/min    Comment: Performed at [facility], [place] [address] [place], [place] [address]  Salicylate level     Status: None   Collection Time: 03/05/19  8:33 PM  Result Value Ref Range   Salicylate Lvl <2.3 2.8 - 30.0 mg/dL    Comment: Performed at [facility], [place] [address], [place], [place] [address]  Protein / creatinine ratio, urine     Status: Abnormal   Collection Time: 03/05/19 10:45 PM  Result Value Ref Range   Creatinine, Urine 68.95 mg/dL   Total Protein, Urine 112 mg/dL    Comment: NO NORMAL RANGE ESTABLISHED FOR THIS TEST   Protein Creatinine Ratio 1.62 (H) 0.00 - 0.15 mg/mg[Cre]    Comment: Performed at [facility], [place] [address] [place], [place] [address]   Comprehensive metabolic panel     Status: Abnormal   Collection Time: 03/06/19  7:20 AM  Result Value Ref Range   Sodium 146 (H) 135 - 145 mmol/L    Comment: REPEATED TO VERIFY   Potassium 5.0 3.5 - 5.1 mmol/L   Chloride 108 98 - 111 mmol/L    Comment: REPEATED TO VERIFY   CO2 16 (L) 22 - 32 mmol/L    Comment: REPEATED TO VERIFY   Glucose, Bld 105 (H) 70 - 99 mg/dL   BUN 137 (H) 6 - 20 mg/dL    Comment: RESULTS CONFIRMED BY MANUAL DILUTION   Creatinine, Ser 6.19 (H) 0.61 - 1.24 mg/dL   Calcium 8.3 (L) 8.9 - 10.3 mg/dL   Total Protein 7.5 6.5 - 8.1 g/dL   Albumin 2.6 (L) 3.5 - 5.0 g/dL   AST 77 (H) 15 - 41 U/L   ALT 66 (H) 0 - 44 U/L   Alkaline Phosphatase 207 (H) 38 - 126 U/L   Total Bilirubin 2.0 (H) 0.3 - 1.2 mg/dL   GFR calc non Af Amer 9 (L) >60 mL/min   GFR calc Af Amer 10 (L) >60 mL/min   Anion gap 22 (H) 5 - 15    Comment: REPEATED TO VERIFY Performed at [facility], [place] [address], [place], [place] [address]   CBC     Status: Abnormal   Collection Time: 03/06/19  7:20 AM  Result Value Ref Range   WBC 16.1 (H) 4.0 - 10.5 K/uL   RBC 4.70 4.22 - 5.81 MIL/uL   Hemoglobin 15.0 13.0 - 17.0 g/dL   HCT 45.8 39.0 - 52.0 %   MCV 97.4 80.0 - 100.0 fL   MCH 31.9 26.0 - 34.0 pg   MCHC 32.8 30.0 - 36.0 g/dL   RDW 14.8 11.5 - 15.5 %   Platelets 275 150 - 400 K/uL   nRBC 0.0 0.0 - 0.2 %    Comment: Performed at [facility], [place] [address], [place], [place] [address]  CK total and CKMB (cardiac)not at [facility]     Status: Abnormal   Collection Time: 03/06/19  7:20 AM  Result Value Ref Range   Total CK 1,159 (H) 49 - 397 U/L   CK, MB 55.1 (H) 0.5 - 5.0 ng/mL   Relative Index 4.8 (H) 0.0 -  2.5    Comment: Performed at [facility], [address] [place], [place] [address]     ROS:  Pertinent items noted in HPI and remainder of comprehensive ROS otherwise negative.  Physical Exam: Vitals:   03/06/19 1230 03/06/19 1300  BP:  (!) 163/100 (!) 126/114  Pulse: 84 84  Resp: 20 19  Temp:    SpO2: 100% 98%     General exam: Ill-looking male lying in bed, not in distress  Respiratory system: Clear to auscultation. Respiratory effort normal. No wheezing or crackle Cardiovascular system: S1 & S2 heard, RRR.  Bilateral trace LE edema with chronic skin changes. Gastrointestinal system: Abdomen is nondistended, soft and nontender. Normal bowel sounds heard. Central nervous system: Alert awake and following commands Extremities: Trace edema in lower extremity, seems dependent and chronic in nature. Skin: No rashes, lesions or ulcers Psychiatry: Judgement and insight appear normal. Mood & affect appropriate.   Assessment/Plan:  #Nonoliguric AKI: Exact baseline creatinine level unknown.  AKI likely due to combination of NSAIDs/rhabdomyolysis and sepsis.  UA with UTI.  Ultrasound kidneys with normal echogenicity and without obstruction. -Change IV fluid with sodium bicarbonate and hypotonic fluid. -Strict ins and out, avoid IV contrast or NSAIDs. -Order bladder scan. -Treat sepsis as per primary team.  Monitor Vanco trough. -No indication for dialysis at this time.  I agree with transferring to [facility].  #Acidosis due to combination of renal failure and lactic acidosis: Switch IV fluid with sodium bicarbonate.  Monitor lab.  #Hypernatremia: Free water deficit and due to normal saline.  Change to hypotonic fluid.  Encourage intake of free water.  Check urine lites and osmolality.  #Hyperkalemia: Potassium level improved to 5 today.  Correcting acidosis and treating with IV fluid.  Monitor lab.  #Sepsis presumed from UTI: On empiric antibiotics.  Follow-up culture results.  Thank you for the consult.  We will continue to follow.   Dron Jaynie CollinsPrasad Bhandari 03/06/2019, 1:16 PM  [facility].

## 2019-03-06 NOTE — ED Notes (Signed)
[facility] at bedside to transport pt to [facility]  

## 2019-03-07 ENCOUNTER — Inpatient Hospital Stay (HOSPITAL_COMMUNITY): Payer: Medicare Other

## 2019-03-07 LAB — PROTEIN ELECTROPHORESIS, SERUM: Total Protein ELP: UNDETERMINED g/dL

## 2019-03-07 LAB — RENAL FUNCTION PANEL
Albumin: 2 g/dL — ABNORMAL LOW (ref 3.5–5.0)
Anion gap: 14 (ref 5–15)
BUN: 120 mg/dL — ABNORMAL HIGH (ref 6–20)
CO2: 17 mmol/L — ABNORMAL LOW (ref 22–32)
Calcium: 8.2 mg/dL — ABNORMAL LOW (ref 8.9–10.3)
Chloride: 109 mmol/L (ref 98–111)
Creatinine, Ser: 4.97 mg/dL — ABNORMAL HIGH (ref 0.61–1.24)
GFR calc Af Amer: 14 mL/min — ABNORMAL LOW (ref >60)
GFR calc non Af Amer: 12 mL/min — ABNORMAL LOW (ref >60)
Glucose, Bld: 104 mg/dL — ABNORMAL HIGH (ref 70–99)
Phosphorus: 5.7 mg/dL — ABNORMAL HIGH (ref 2.5–4.6)
Potassium: 4.4 mmol/L (ref 3.5–5.1)
Sodium: 140 mmol/L (ref 135–145)

## 2019-03-07 LAB — CBC
HCT: 40 % (ref 39.0–52.0)
Hemoglobin: 13.3 g/dL (ref 13.0–17.0)
MCH: 31.7 pg (ref 26.0–34.0)
MCHC: 33.3 g/dL (ref 30.0–36.0)
MCV: 95.2 fL (ref 80.0–100.0)
Platelets: 285 10*3/uL (ref 150–400)
RBC: 4.2 MIL/uL — ABNORMAL LOW (ref 4.22–5.81)
RDW: 14.6 % (ref 11.5–15.5)
WBC: 16.2 10*3/uL — ABNORMAL HIGH (ref 4.0–10.5)
nRBC: 0 % (ref 0.0–0.2)

## 2019-03-07 MED ORDER — TECHNETIUM TC 99M MEBROFENIN IV KIT
4.8000 | PACK | Freq: Once | INTRAVENOUS | Status: AC | PRN
  Administered 2019-03-07: 12:00:00 4.8 via INTRAVENOUS

## 2019-03-07 MED ORDER — GERHARDT'S BUTT CREAM
TOPICAL_CREAM | Freq: Two times a day (BID) | CUTANEOUS | Status: DC
  Administered 2019-03-07 (×2): via TOPICAL
  Administered 2019-03-08: 1 via TOPICAL
  Administered 2019-03-08 – 2019-03-09 (×2): via TOPICAL
  Filled 2019-03-07 (×2): qty 1

## 2019-03-07 MED ORDER — NEPRO/CARBSTEADY PO LIQD
237.0000 mL | Freq: Three times a day (TID) | ORAL | Status: DC
  Administered 2019-03-07 – 2019-03-09 (×6): 237 mL via ORAL

## 2019-03-07 MED ORDER — STERILE WATER FOR INJECTION IV SOLN
INTRAVENOUS | Status: DC
  Administered 2019-03-07 – 2019-03-08 (×2): via INTRAVENOUS
  Filled 2019-03-07 (×3): qty 850

## 2019-03-07 NOTE — TOC Progression Note (Signed)
Transition of Care [facility]) - Progression Note    Patient Details  Name: Aaric Dolph MRN: [id number] Date of Birth: 1955-08-06  Transition of [facility]) CM/SW Katie, LCSW Phone Number: 03/07/2019, 8:35 AM  Clinical Narrative:    CSW acknowledges patient request for SNF placement. CSW will await PT consult and patient will require a 3-night inpatient stay to meet Medicare SNF guidelines.    Expected Discharge Plan: [place] Barriers to Discharge: Continued Medical Work up  Expected Discharge Plan and Services Expected Discharge Plan: [place] In-house Referral: Clinical Social Work Discharge Planning Services: CM Consult   Living arrangements for the past 2 months: Hotel/Motel([facility] on [address])                                       Social Determinants of Health (SDOH) Interventions    Readmission Risk Interventions No flowsheet data found.

## 2019-03-07 NOTE — Progress Notes (Addendum)
Initial Nutrition Assessment  RD working remotely.  DOCUMENTATION CODES:   Obesity unspecified  INTERVENTION:   -Nepro Shake po TID, each supplement provides 425 kcal and 19 grams protein  NUTRITION DIAGNOSIS:   Increased nutrient needs related to wound healing as evidenced by estimated needs.  GOAL:   Patient will meet greater than or equal to 90% of their needs  MONITOR:   PO intake, Supplement acceptance, Labs, Weight trends, Skin, I & O's  REASON FOR ASSESSMENT:   Low Braden    ASSESSMENT:   Jacob Garza  is a 63 y.o. male, w wheelchair bound due to chronic knee pain,  fell out of wheel chair 2 days ago. He was too weak to get up.   Pt was found on floor today.  Pt admitted with ARF secondary to ?rhabdomyolosis.   Reviewed I/O's: -1.9 L x 24 hours and -1.3 L since admission  UOP: 2.8 L x 24 hours  Attempted to speak with pt via phone, however, no answer. Unable to obtain further nutrition-related history at this time.   Per [facility] note, pt with multiple areas of MASD and weeping legs. Pt would greatly benefit from addition of nutritional supplements due to increased nutrient needs for wound healing.   Reviewed wt hx; noted wt increase, which is likely related to significant edema.   Per CSW notes, pt lives alone in [place] and has an ill-fitting wheelchair. He is agreeable to SNF placement.   Labs reviewed: Phos: 5.7.   Diet Order:   Diet Order            Diet renal with fluid restriction Room service appropriate? Yes; Fluid consistency: Thin  Diet effective now              EDUCATION NEEDS:   No education needs have been identified at this time  Skin:  Skin Assessment: Skin Integrity Issues: Skin Integrity Issues:: Unstageable, Other (Comment) Unstageable: lt hip Other: generalized MASD  Last BM:  03/06/19  Height:   Ht Readings from Last 1 Encounters:  03/05/19 6\' 5"  (1.956 m)    Weight:   Wt Readings from Last 1 Encounters:  03/07/19  (!) 181.9 kg    Ideal Body Weight:  94.5 kg  BMI:  Body mass index is 47.55 kg/m.  Estimated Nutritional Needs:   Kcal:  0258-5277  Protein:  145-160 grams  Fluid:  > 2.3 L    Avriel Kandel A. Jimmye Norman, RD, LDN, [place] Registered Dietitian II Certified Diabetes Care and Education Specialist Pager: [phone] After hours Pager: [phone]

## 2019-03-07 NOTE — Consult Note (Signed)
[place] Nurse wound consult note Reason for Consult:Moisture associated skin damage (MASD) from prolonged period down, unable to get up, while soiled.  Patient is in ill fitting wheelchair.  Lives alone in a motel.  Social work has been consulted and patient agrees to short term rehab to gain strength and knowledge on how to safely transfer and care for self.  Cellulitis to bilateral lower legs, weeping and denuded.  Wound type:Moisture and pressure to buttocks and sacrum Infectious to legs Pressure Injury POA: Yes Measurement: Bedside Rn to obtain Wound CZY:SAYT and moist Drainage (amount, consistency, odor) moderate weeping.  No odor Periwound: erythema and tenderness Dressing procedure/placement/frequency: Cleanse legs with soap and water and pat dry.  Apply Xeroform gauze to red, weeping nonintact skin.  Cover with kerlix and tape.  Change daily.  Cleanse buttocks wounds with soap and water. Apply gerhardts butt pastes.  No disposable briefs or underpads.  Exposure to dermatherapy will wick moisture and improve skin. Mattress with low air loss feature.  Will not follow at this time.  Please re-consult if needed.  Domenic Moras MSN, RN, FNP-BC CWON Wound, Ostomy, Continence Nurse Pager [phone]

## 2019-03-07 NOTE — Progress Notes (Signed)
PROGRESS NOTE    Jacob Garza  HNG:[id number] DOB: May 16, 1956 DOA: 03/05/2019 PCP: Patient, No Pcp Per   Brief Narrative:  Patient is a 63 year old male with history of chronic pain syndrome, arthritis, inability to walk and wheelchair-bound who presented to the emergency department after he fell from his wheelchair at a motel and was unable to get up.  Patient was found to have rhabdomyolysis and acute kidney injury on presentation.  Admitted for further management.  Nephrology following for AKI.  Assessment & Plan:   Principal Problem:   ARF (acute renal failure) (HCC) Active Problems:   Rhabdomyolysis   Hyperkalemia   Acute lower UTI   Sepsis (HCC)   Acute kidney injury: Baseline creatinine unknown.  He was taking NSAIDs for chronic arthritis. Acute kidney injury most likely secondary to acute tubular necrosis secondary to rhabdomyolysis.  Patient having good urine output.  Continue IV fluids.  Expect full recovery.  Nephrology following. Renal ultrasound did not show any hydronephrosis.  Rhabdomyolysis: Secondary to muscle injury due to immobility and  lying on the floor.  Continue IV fluids.  Suspected sepsis: Presented with lactic acidosis, leukocytosis, elevated procalcitonin on presentation.  Currently hemodynamically stable.  Afebrile.  Urinalysis suggestive  of UTI.  Urine culture showing significant Klebsiella pneumonia.  Blood cultures have been sent and results are pending.  Continue cefepime for now.  Patient also has significant panniculitis  and ulcerations on the scrotum which donot appear infected..  Panniculitis/ulceration of the scrotum: Wound care following.  Continue to keep the perineal area dry.  Elevated liver function tests: Most likely associated rhabdomyolysis.  Right upper quadrant ultrasound showed cholelithiasis but there is no evidence of cholecystitis.  Patient denies any abdomen pain.  Hypertension: Poorly controlled.  Home medicines restarted.   Currently blood pressure stable  Chronic arthritis/debility/deconditioning: Wheelchair-bound.  Unable to ambulate since last many years.  Lives in [place].  Has deformity on the hands and bilateral lower extremities.  I have requested for PT evaluation.  Complicated renal cyst: Ultrasound showed left complicated renal cyst.  Follow-up as an outpatient with nephrology.     Nutrition Problem: Increased nutrient needs Etiology: wound healing      DVT prophylaxis:Heparin [place] Code Status: Full Family Communication: None present at the bed side Disposition Plan: Home vs SNF after further improvement in the renal function   Consultants: Nephrology  Procedures: None  Antimicrobials:  Anti-infectives (From admission, onward)   Start     Dose/Rate Route Frequency Ordered Stop   03/06/19 2200  ceFEPIme (MAXIPIME) 2 g in sodium chloride 0.9 % 100 mL IVPB     2 g 200 mL/hr over 30 Minutes Intravenous Every 24 hours 03/06/19 0058     03/05/19 2300  vancomycin (VANCOCIN) IVPB 1000 mg/200 mL premix     1,000 mg 200 mL/hr over 60 Minutes Intravenous  Once 03/05/19 2148 03/05/19 2310   03/05/19 2200  vancomycin (VANCOCIN) 2,000 mg in sodium chloride 0.9 % 500 mL IVPB  Status:  Discontinued     2,000 mg 250 mL/hr over 120 Minutes Intravenous  Once 03/05/19 2131 03/05/19 2147   03/05/19 2115  ceFEPIme (MAXIPIME) 2 g in sodium chloride 0.9 % 100 mL IVPB     2 g 200 mL/hr over 30 Minutes Intravenous  Once 03/05/19 2114 03/05/19 2236   03/05/19 2115  vancomycin (VANCOCIN) IVPB 1000 mg/200 mL premix  Status:  Discontinued     1,000 mg 200 mL/hr over 60 Minutes Intravenous  Once 03/05/19 2114  03/05/19 2236      Subjective:  Patient seen and examined at bedside this morning.  Hemodynamically stable.  He says he feels  miserable due to pain on bilateral lower extremities, ulcerations on the abdominal folds and scrotum.  Denies any chest pain or shortness of breath.  Objective: Vitals:    03/06/19 2203 03/07/19 0426 03/07/19 0429 03/07/19 0800  BP: 130/72 132/79  117/76  Pulse: 75 64  72  Resp:      Temp: 98 F (36.7 C) (!) 97.5 F (36.4 C)  (!) 97.3 F (36.3 C)  TempSrc:    Oral  SpO2: 100% 100%  97%  Weight:   (!) 181.9 kg (!) 181.9 kg  Height:        Intake/Output Summary (Last 24 hours) at 03/07/2019 1330 Last data filed at 03/07/2019 1120 Gross per 24 hour  Intake 964.1 ml  Output 2975 ml  Net -2010.9 ml   Filed Weights   03/05/19 1723 03/07/19 0429 03/07/19 0800  Weight: 108.9 kg (!) 181.9 kg (!) 181.9 kg    Examination:  General exam:Not in distress, morbidly obese, extremely debilitated/deconditioned HEENT:PERRL,Oral mucosa moist, Ear/Nose normal on gross exam Respiratory system: Bilateral equal air entry, normal vesicular breath sounds, no wheezes or crackles  Cardiovascular system: S1 & S2 heard, RRR. No JVD, murmurs, rubs, gallops or clicks. No pedal edema. Gastrointestinal system: Abdomen is nondistended, soft and nontender. No organomegaly or masses felt. Normal bowel sounds heard.  Panniculitis Central nervous system: Alert and oriented. No focal neurological deficits. Extremities: No edema, no clubbing ,no cyanosis, deformities on the hands due to arthritis, deformities on bilateral foot due to prolonged immobility  skin: Ulceration on the abdomen folds, scrotum Psychiatry: Judgement and insight appear normal. Mood & affect appropriate.     Data Reviewed: I have personally reviewed following labs and imaging studies  CBC: Recent Labs  Lab 03/05/19 1820 03/06/19 0720 03/07/19 0304  WBC 19.0* 16.1* 16.2*  NEUTROABS 17.1*  --   --   HGB 15.0 15.0 13.3  HCT 46.0 45.8 40.0  MCV 96.6 97.4 95.2  PLT 318 275 285   Basic Metabolic Panel: Recent Labs  Lab 03/05/19 1820 03/05/19 2033 03/06/19 0720 03/06/19 1344 03/07/19 0304  NA 142  --  146* 144 140  K 5.5*  --  5.0 5.2* 4.4  CL 105  --  108 110 109  CO2 12*  --  16* 18* 17*    GLUCOSE 83  --  105* 114* 104*  BUN 82*  --  137* 134* 120*  CREATININE 6.82* 6.67* 6.19* 5.67* 4.97*  CALCIUM 8.2*  --  8.3* 8.1* 8.2*  PHOS  --   --   --   --  5.7*   GFR: Estimated Creatinine Clearance: 28.2 mL/min (A) (by C-G formula based on SCr of 4.97 mg/dL (H)). Liver Function Tests: Recent Labs  Lab 03/05/19 2033 03/06/19 0720 03/07/19 0304  AST 102* 77*  --   ALT 72* 66*  --   ALKPHOS 218* 207*  --   BILITOT 2.9* 2.0*  --   PROT 7.8 7.5  --   ALBUMIN 2.6* 2.6* 2.0*   No results for input(s): LIPASE, AMYLASE in the last 168 hours. No results for input(s): AMMONIA in the last 168 hours. Coagulation Profile: Recent Labs  Lab 03/05/19 1820  INR 1.3*   Cardiac Enzymes: Recent Labs  Lab 03/05/19 1820 03/06/19 0720  CKTOTAL 2,155* 1,159*  CKMB  --  55.1*  BNP (last 3 results) No results for input(s): PROBNP in the last 8760 hours. HbA1C: No results for input(s): HGBA1C in the last 72 hours. CBG: No results for input(s): GLUCAP in the last 168 hours. Lipid Profile: No results for input(s): CHOL, HDL, LDLCALC, TRIG, CHOLHDL, LDLDIRECT in the last 72 hours. Thyroid Function Tests: No results for input(s): TSH, T4TOTAL, FREET4, T3FREE, THYROIDAB in the last 72 hours. Anemia Panel: No results for input(s): VITAMINB12, FOLATE, FERRITIN, TIBC, IRON, RETICCTPCT in the last 72 hours. Sepsis Labs: Recent Labs  Lab 03/05/19 1820 03/05/19 2031  PROCALCITON >150.00  --   LATICACIDVEN  --  2.0*    Recent Results (from the past 240 hour(s))  Blood Culture (routine x 2)     Status: None (Preliminary result)   Collection Time: 03/05/19  8:31 PM   Specimen: BLOOD  Result Value Ref Range Status   Specimen Description   Final    BLOOD RIGHT ANTECUBITAL Performed at [facility] [address], [place], [place] [address]    Special Requests   Final    BOTTLES DRAWN AEROBIC AND ANAEROBIC Blood Culture results may not be optimal due to an  excessive volume of blood received in culture bottles Performed at [facility] [address], [place], [place] [address]    Culture   Final    NO GROWTH 2 DAYS Performed at [facility] [address] [place], [place] [address]    Report Status PENDING  Incomplete  Blood Culture (routine x 2)     Status: None (Preliminary result)   Collection Time: 03/05/19  8:32 PM   Specimen: BLOOD  Result Value Ref Range Status   Specimen Description   Final    BLOOD LEFT ANTECUBITAL Performed at [facility] [address] [place], [place] [address]    Special Requests   Final    BOTTLES DRAWN AEROBIC AND ANAEROBIC Blood Culture results may not be optimal due to an excessive volume of blood received in culture bottles Performed at [facility] [address], [place], [place] [address]    Culture   Final    NO GROWTH 2 DAYS Performed at [facility] [address] [place], [place] [address]    Report Status PENDING  Incomplete  Urine culture     Status: Abnormal (Preliminary result)   Collection Time: 03/05/19  8:32 PM   Specimen: In/Out Cath Urine  Result Value Ref Range Status   Specimen Description   Final    IN/OUT CATH URINE Performed at [facility] [address], [place], [place] [address]    Special Requests   Final    NONE Performed at [facility], [place] [address], [place], [place] [address]    Culture (A)  Final    >=100,000 COLONIES/mL KLEBSIELLA PNEUMONIAE SUSCEPTIBILITIES TO FOLLOW Performed at [facility], [place] [address] [place], [place] [address]    Report Status PENDING  Incomplete  SARS Coronavirus 2 [facility] order, Performed in [facility] hospital lab) Nasopharyngeal Nasopharyngeal Swab     Status: None   Collection Time: 03/06/19  4:56 PM   Specimen: Nasopharyngeal Swab  Result Value Ref Range Status   SARS Coronavirus 2 NEGATIVE  NEGATIVE Final    Comment: (NOTE) If result is NEGATIVE SARS-CoV-2 target nucleic acids are NOT DETECTED. The SARS-CoV-2 RNA is generally detectable in upper and lower  respiratory specimens during the  acute phase of infection. The lowest  concentration of SARS-CoV-2 viral copies this assay can detect is 250  copies / mL. A negative result does not preclude SARS-CoV-2 infection  and should not be used as the sole basis for treatment or other  patient management decisions.  A negative result may occur with  improper specimen collection / handling, submission of specimen other  than nasopharyngeal swab, presence of viral mutation(s) within the  areas targeted by this assay, and inadequate number of viral copies  (<250 copies / mL). A negative result must be combined with clinical  observations, patient history, and epidemiological information. If result is POSITIVE SARS-CoV-2 target nucleic acids are DETECTED. The SARS-CoV-2 RNA is generally detectable in upper and lower  respiratory specimens dur ing the acute phase of infection.  Positive  results are indicative of active infection with SARS-CoV-2.  Clinical  correlation with patient history and other diagnostic information is  necessary to determine patient infection status.  Positive results do  not rule out bacterial infection or co-infection with other viruses. If result is PRESUMPTIVE POSTIVE SARS-CoV-2 nucleic acids MAY BE PRESENT.   A presumptive positive result was obtained on the submitted specimen  and confirmed on repeat testing.  While 2019 novel coronavirus  (SARS-CoV-2) nucleic acids may be present in the submitted sample  additional confirmatory testing may be necessary for epidemiological  and / or clinical management purposes  to differentiate between  SARS-CoV-2 and other Sarbecovirus currently known to infect humans.  If clinically indicated additional testing with an alternate test  methodology [phone]) is  advised. The SARS-CoV-2 RNA is generally  detectable in upper and lower respiratory sp ecimens during the acute  phase of infection. The expected result is Negative. Fact Sheet for Patients:  BoilerBrush.com.cy Fact Sheet for Healthcare Providers: https://pope.com/ This test is not yet approved or cleared by the [place] FDA and has been authorized for detection and/or diagnosis of SARS-CoV-2 by FDA under an Emergency Use Authorization (EUA).  This EUA will remain in effect (meaning this test can be used) for the duration of the COVID-19 declaration under Section 564(b)(1) of the Act, 21 U.S.C. section 360bbb-3(b)(1), unless the authorization is terminated or revoked sooner. Performed at [facility], [address] [place], [place] [address]          Radiology Studies: US Renal  Result Date: 03/05/2019 CLINICAL DATA:  Acute kidney injury EXAM: RENAL / URINARY TRACT ULTRASOUND COMPLETE COMPARISON:  None. FINDINGS: Right Kidney: Renal measurements: 11.2 x 6.8 x 6.6 cm = volume: 239.3 mL . Echogenicity within normal limits. No mass or hydronephrosis visualized. Left Kidney: Renal measurements: 10.6 x 6.5 x 5.3 cm = volume: 189.6 mL. Cortical echogenicity normal. No hydronephrosis. Cyst containing scattered echoes off lower pole left kidney measuring 3.3 x 3.6 x 3.6 cm. Bladder: Appears normal for degree of bladder distention. IMPRESSION: 1. Negative for hydronephrosis. 2. Slightly complicated cyst inferior left kidney Electronically Signed   By: Jasmine Pang M.D.   On: 03/05/2019 22:38   Dg Chest Port 1 View  Result Date: 03/05/2019 CLINICAL DATA:  Sepsis EXAM: PORTABLE CHEST 1 VIEW COMPARISON:  None. FINDINGS: The heart size and mediastinal contours are within normal limits. Both lungs are clear. The visualized skeletal structures are unremarkable. IMPRESSION: No acute abnormality of the lungs in AP portable  projection. Electronically Signed   By: Lauralyn Primes M.D.   On: 03/05/2019 19:44   US Abdomen Limited Ruq  Result Date: 03/05/2019  CLINICAL DATA:  Abnormal liver function test EXAM: ULTRASOUND ABDOMEN LIMITED RIGHT UPPER QUADRANT COMPARISON:  None. FINDINGS: Gallbladder: Diffuse shadowing within the gallbladder presumably due to stone filled gallbladder. Increased wall thickness 4 mm. Negative sonographic Murphy Common bile duct: Diameter: 3.8 mm Liver: Heterogeneous liver echotexture with areas of increased echogenicity. No discrete mass. Portal vein is patent on color Doppler imaging with normal direction of blood flow towards the liver. Other: None. IMPRESSION: 1. Diffuse shadowing within the gallbladder, presumably due to stone filled gallbladder. Nonspecific mild increased wall thickness, could be secondary to acute or chronic cholecystitis, or liver disease. Sonographic Eulah PontMurphy was negative. If further evaluation is required, consider correlation with nuclear medicine hepatobiliary imaging 2. Heterogeneous liver with areas of increased hepatic echogenicity, suspect that there may be heterogenous fat infiltration. Electronically Signed   By: Jasmine PangKim  Fujinaga M.D.   On: 03/05/2019 23:31        Scheduled Meds:  amLODipine  10 mg Oral Daily   feeding supplement (NEPRO CARB STEADY)  237 mL Oral TID BM   Gerhardt's butt cream   Topical BID   heparin  5,000 Units Subcutaneous Q8H   nystatin   Topical TID   Continuous Infusions:  ceFEPime (MAXIPIME) IV 2 g (03/06/19 2257)    sodium bicarbonate (isotonic) infusion in sterile water       LOS: 2 days    Time spent: 35 mins. More than 50% of that time was spent in counseling and/or coordination of care.      Burnadette PopAmrit Brysten Reister, MD [facility] Pager [phone]  If 7PM-7AM, please contact night-coverage [email or web address] Password [email or web address] 03/07/2019, 1:30 PM

## 2019-03-07 NOTE — Plan of Care (Signed)
  Problem: Pain Managment: Goal: General experience of comfort will improve Outcome: Progressing   Problem: Education: Goal: Knowledge of General Education information will improve Description: Including pain rating scale, medication(s)/side effects and non-pharmacologic comfort measures Outcome: Progressing   Problem: Skin Integrity: Goal: Risk for impaired skin integrity will decrease Outcome: Not Progressing

## 2019-03-07 NOTE — Progress Notes (Signed)
[facility] NEPHROLOGY PROGRESS NOTE  Assessment/ Plan: Pt is a 63 y.o. yo male  with history of chronic pain, neuropathy with wheelchair-bound who was presented to the hospital after being found down on the floor found to have AKI with creatinine level 6.82, CKD 2155 and sepsis.  #Nonoliguric AKI: Exact baseline creatinine level unknown.  AKI likely due to combination of NSAIDs/rhabdomyolysis and sepsis.  UA with UTI.  Ultrasound kidneys with normal echogenicity and without obstruction. -Patient has good urine output of 2.8 L since yesterday and serum creatinine level trended down to 4.9.  BUN is improving.  He has no features of uremia.  Continue IV bicarbonate.  Treatment of sepsis per primary team.  No indication for dialysis.  #Acidosis due to combination of renal failure and lactic acidosis: Continue sodium bicarbonate.  Monitor lab.  #Hypernatremia: Free water deficit and due to normal saline.  Serum sodium level improved therefore I will change to isotonic fluid.  #Hyperkalemia: Improved, potassium level 4.4 today.  #Sepsis presumed from UTI: On empiric antibiotics.    Urine culture grew gram-negative rod.  #Physical deconditioning: PT OT evaluation.  Patient will likely need SNF on discharge.   Subjective: Seen and examined.  Denies nausea vomiting chest pain shortness of breath.  He has good urine output. Objective Vital signs in last 24 hours: Vitals:   03/06/19 2203 03/07/19 0426 03/07/19 0429 03/07/19 0800  BP: 130/72 132/79  117/76  Pulse: 75 64  72  Resp:      Temp: 98 F (36.7 C) (!) 97.5 F (36.4 C)  (!) 97.3 F (36.3 C)  TempSrc:    Oral  SpO2: 100% 100%  97%  Weight:   (!) 181.9 kg (!) 181.9 kg  Height:       Weight change: 73 kg  Intake/Output Summary (Last 24 hours) at 03/07/2019 1040 Last data filed at 03/07/2019 0500 Gross per 24 hour  Intake 964.1 ml  Output 2825 ml  Net -1860.9 ml       Labs: Basic Metabolic Panel: Recent  Labs  Lab 03/06/19 0720 03/06/19 1344 03/07/19 0304  NA 146* 144 140  K 5.0 5.2* 4.4  CL 108 110 109  CO2 16* 18* 17*  GLUCOSE 105* 114* 104*  BUN 137* 134* 120*  CREATININE 6.19* 5.67* 4.97*  CALCIUM 8.3* 8.1* 8.2*  PHOS  --   --  5.7*   Liver Function Tests: Recent Labs  Lab 03/05/19 2033 03/06/19 0720 03/07/19 0304  AST 102* 77*  --   ALT 72* 66*  --   ALKPHOS 218* 207*  --   BILITOT 2.9* 2.0*  --   PROT 7.8 7.5  --   ALBUMIN 2.6* 2.6* 2.0*   No results for input(s): LIPASE, AMYLASE in the last 168 hours. No results for input(s): AMMONIA in the last 168 hours. CBC: Recent Labs  Lab 03/05/19 1820 03/06/19 0720 03/07/19 0304  WBC 19.0* 16.1* 16.2*  NEUTROABS 17.1*  --   --   HGB 15.0 15.0 13.3  HCT 46.0 45.8 40.0  MCV 96.6 97.4 95.2  PLT 318 275 285   Cardiac Enzymes: Recent Labs  Lab 03/05/19 1820 03/06/19 0720  CKTOTAL 2,155* 1,159*  CKMB  --  55.1*   CBG: No results for input(s): GLUCAP in the last 168 hours.  Iron Studies: No results for input(s): IRON, TIBC, TRANSFERRIN, FERRITIN in the last 72 hours. Studies/Results: US Renal  Result Date: 03/05/2019 CLINICAL DATA:  Acute kidney injury EXAM: RENAL /  URINARY TRACT ULTRASOUND COMPLETE COMPARISON:  None. FINDINGS: Right Kidney: Renal measurements: 11.2 x 6.8 x 6.6 cm = volume: 239.3 mL . Echogenicity within normal limits. No mass or hydronephrosis visualized. Left Kidney: Renal measurements: 10.6 x 6.5 x 5.3 cm = volume: 189.6 mL. Cortical echogenicity normal. No hydronephrosis. Cyst containing scattered echoes off lower pole left kidney measuring 3.3 x 3.6 x 3.6 cm. Bladder: Appears normal for degree of bladder distention. IMPRESSION: 1. Negative for hydronephrosis. 2. Slightly complicated cyst inferior left kidney Electronically Signed   By: Donavan Foil M.D.   On: 03/05/2019 22:38   Dg Chest Port 1 View  Result Date: 03/05/2019 CLINICAL DATA:  Sepsis EXAM: PORTABLE CHEST 1 VIEW COMPARISON:  None.  FINDINGS: The heart size and mediastinal contours are within normal limits. Both lungs are clear. The visualized skeletal structures are unremarkable. IMPRESSION: No acute abnormality of the lungs in AP portable projection. Electronically Signed   By: Eddie Candle M.D.   On: 03/05/2019 19:44   US Abdomen Limited Ruq  Result Date: 03/05/2019 CLINICAL DATA:  Abnormal liver function test EXAM: ULTRASOUND ABDOMEN LIMITED RIGHT UPPER QUADRANT COMPARISON:  None. FINDINGS: Gallbladder: Diffuse shadowing within the gallbladder presumably due to stone filled gallbladder. Increased wall thickness 4 mm. Negative sonographic Murphy Common bile duct: Diameter: 3.8 mm Liver: Heterogeneous liver echotexture with areas of increased echogenicity. No discrete mass. Portal vein is patent on color Doppler imaging with normal direction of blood flow towards the liver. Other: None. IMPRESSION: 1. Diffuse shadowing within the gallbladder, presumably due to stone filled gallbladder. Nonspecific mild increased wall thickness, could be secondary to acute or chronic cholecystitis, or liver disease. Sonographic Percell Miller was negative. If further evaluation is required, consider correlation with nuclear medicine hepatobiliary imaging 2. Heterogeneous liver with areas of increased hepatic echogenicity, suspect that there may be heterogenous fat infiltration. Electronically Signed   By: Donavan Foil M.D.   On: 03/05/2019 23:31    Medications: Infusions: . ceFEPime (MAXIPIME) IV 2 g (03/06/19 2257)  .  sodium bicarbonate  infusion 1000 mL 125 mL/hr at 03/07/19 0224    Scheduled Medications: . amLODipine  10 mg Oral Daily  . Gerhardt's butt cream   Topical BID  . heparin  5,000 Units Subcutaneous Q8H  . nystatin   Topical TID    have reviewed scheduled and prn medications.  Physical Exam: General:NAD, comfortable Heart:RRR, s1s2 nl Lungs:clear b/l, no crackle Abdomen:soft, Non-tender, non-distended Extremities:No  edema Neurology: Alert awake and following commands Skin: Multiple ulcers and excoriation around the groin and scrotal area.  Debbie Bellucci Prasad Keileigh Vahey 03/07/2019,10:40 AM  LOS: 2 days  Pager: [phone]

## 2019-03-07 NOTE — Progress Notes (Signed)
Report received from night shift. Examined wounds and changed linens, placing dermatherapy pillow cases between abdomen folds and between scrotum and thighs until wound care instructions. 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 On the left hip over bony prominance is a large area that is yellowish scabbed over. Left buttock is oozing serosanguinous drainage at multiple points. We patted clean and placed disposable pads due to lack of cloth pads and the previous cloth pads being saturated with drainage. Low air loss mattress ordered - plan to move pt when pain medication can be administered.  Pt's legs are stiff and contracted, as are hands. Pt's hands are flexed at the metocarpal joints. Knees are about 90 degrees flexion and painful to touch. Left lower leg is warmer than right, though it did not feel hot to this nurse's assessment while night shift reported it had been hot.  Report given per hand off flowsheet.

## 2019-03-07 NOTE — NC FL2 (Signed)
[place] LEVEL OF CARE SCREENING TOOL     IDENTIFICATION  Patient Name: Jacob Garza Birthdate: 08-24-1955 Sex: male Admission Date (Current Location): 03/05/2019  [facility] and [place] Number:  Herbalist and Address:  [facility]. [facility], [place] [address] [place], [place] [address]      Provider Number: [id number]  Attending Physician Name and Address:  Shelly Coss, MD  Relative Name and Phone Number:  Danton Clap mother, [phone]    Current Level of Care: Hospital Recommended Level of Care: [place] Prior Approval Number:    Date Approved/Denied:   PASRR Number: [id number]  Discharge Plan: SNF    Current Diagnoses: Patient Active Problem List   Diagnosis Date Noted  . Rhabdomyolysis 03/05/2019  . ARF (acute renal failure) ([place]) 03/05/2019  . Hyperkalemia 03/05/2019  . Acute lower UTI 03/05/2019  . Sepsis ([place]) 03/05/2019    Orientation RESPIRATION BLADDER Height & Weight     Self, Time, Situation, Place  Normal Incontinent Weight: (!) 401 lb 0.3 oz (181.9 kg) Height:  6\' 5"  (195.6 cm)  BEHAVIORAL SYMPTOMS/MOOD NEUROLOGICAL BOWEL NUTRITION STATUS  (None)   Continent Diet(Please see DC Summary)  AMBULATORY STATUS COMMUNICATION OF NEEDS Skin   Extensive Assist Verbally PU Stage and Appropriate Care(Moisture and pressure to buttocks and sacrum; weeping on legs)                       Personal Care Assistance Level of Assistance  Bathing, Feeding, Dressing Bathing Assistance: Maximum assistance Feeding assistance: Independent Dressing Assistance: Limited assistance     Functional Limitations Info  Sight, Hearing, Speech Sight Info: Adequate Hearing Info: Adequate Speech Info: Adequate    SPECIAL CARE FACTORS FREQUENCY  PT (By licensed PT), OT (By licensed OT)     PT Frequency: 5x OT Frequency: 5x            Contractures Contractures Info: Not present    Additional Factors Info   Code Status, Allergies Code Status Info: Full Allergies Info: NKA           Current Medications (03/07/2019):  This is the current hospital active medication list Current Facility-Administered Medications  Medication Dose Route Frequency Provider Last Rate Last Dose  . amLODipine (NORVASC) tablet 10 mg  10 mg Oral Daily Mariel Aloe, MD   10 mg at 03/06/19 1654  . ceFEPIme (MAXIPIME) 2 g in sodium chloride 0.9 % 100 mL IVPB  2 g Intravenous Q24H Dorrene German, RPH 200 mL/hr at 03/06/19 2257 2 g at 03/06/19 2257  . Gerhardt's butt cream   Topical BID Shelly Coss, MD      . heparin injection 5,000 Units  5,000 Units Subcutaneous Q8H Jani Gravel, MD   5,000 Units at 03/07/19 [phone]  . HYDROcodone-acetaminophen (NORCO/VICODIN) 5-325 MG per tablet 1-2 tablet  1-2 tablet Oral Q6H PRN Mariel Aloe, MD   2 tablet at 03/07/19 0222  . nystatin (MYCOSTATIN/NYSTOP) topical powder   Topical TID Jani Gravel, MD      . sodium bicarbonate 100 mEq in dextrose 5 % 1,000 mL infusion   Intravenous Continuous Rosita Fire, MD 125 mL/hr at 03/07/19 0224    . traMADol (ULTRAM) tablet 50 mg  50 mg Oral Q12H PRN Mariel Aloe, MD   50 mg at 03/06/19 2245     Discharge Medications: Please see discharge summary for a list of discharge medications.  Relevant Imaging Results:  Relevant  Lab Results:   Additional Information SSN: [id number]     COVID negative on 03/06/19  Mearl Latin, LCSW

## 2019-03-08 LAB — CBC WITH DIFFERENTIAL/PLATELET
Abs Immature Granulocytes: 0.66 10*3/uL — ABNORMAL HIGH (ref 0.00–0.07)
Basophils Absolute: 0 10*3/uL (ref 0.0–0.1)
Basophils Relative: 0 %
Eosinophils Absolute: 0.1 10*3/uL (ref 0.0–0.5)
Eosinophils Relative: 0 %
HCT: 38.4 % — ABNORMAL LOW (ref 39.0–52.0)
Hemoglobin: 13.5 g/dL (ref 13.0–17.0)
Immature Granulocytes: 4 %
Lymphocytes Relative: 8 %
Lymphs Abs: 1.2 10*3/uL (ref 0.7–4.0)
MCH: 33 pg (ref 26.0–34.0)
MCHC: 35.2 g/dL (ref 30.0–36.0)
MCV: 93.9 fL (ref 80.0–100.0)
Monocytes Absolute: 0.9 10*3/uL (ref 0.1–1.0)
Monocytes Relative: 6 %
Neutro Abs: 12.3 10*3/uL — ABNORMAL HIGH (ref 1.7–7.7)
Neutrophils Relative %: 82 %
Platelets: 266 10*3/uL (ref 150–400)
RBC: 4.09 MIL/uL — ABNORMAL LOW (ref 4.22–5.81)
RDW: 14.3 % (ref 11.5–15.5)
WBC: 15.1 10*3/uL — ABNORMAL HIGH (ref 4.0–10.5)
nRBC: 0 % (ref 0.0–0.2)

## 2019-03-08 LAB — RENAL FUNCTION PANEL
Albumin: 2 g/dL — ABNORMAL LOW (ref 3.5–5.0)
Anion gap: 12 (ref 5–15)
BUN: 98 mg/dL — ABNORMAL HIGH (ref 6–20)
CO2: 22 mmol/L (ref 22–32)
Calcium: 8.6 mg/dL — ABNORMAL LOW (ref 8.9–10.3)
Chloride: 107 mmol/L (ref 98–111)
Creatinine, Ser: 3.56 mg/dL — ABNORMAL HIGH (ref 0.61–1.24)
GFR calc Af Amer: 20 mL/min — ABNORMAL LOW (ref >60)
GFR calc non Af Amer: 18 mL/min — ABNORMAL LOW (ref >60)
Glucose, Bld: 103 mg/dL — ABNORMAL HIGH (ref 70–99)
Phosphorus: 4.3 mg/dL (ref 2.5–4.6)
Potassium: 4.2 mmol/L (ref 3.5–5.1)
Sodium: 141 mmol/L (ref 135–145)

## 2019-03-08 MED ORDER — INFLUENZA VAC SPLIT QUAD 0.5 ML IM SUSY
0.5000 mL | PREFILLED_SYRINGE | INTRAMUSCULAR | Status: AC
  Administered 2019-03-09: 11:00:00 0.5 mL via INTRAMUSCULAR
  Filled 2019-03-08: qty 0.5

## 2019-03-08 NOTE — Progress Notes (Signed)
PROGRESS NOTE    Jacob Garza  ENM:[id number] DOB: 07/09/1955 DOA: 03/05/2019 PCP: Patient, No Pcp Per   Brief Narrative:  Patient is a 63 year old male with history of chronic pain syndrome, arthritis, inability to walk and wheelchair-bound who presented to the emergency department after he fell from his wheelchair at a motel and was unable to get up.  Patient was found to have rhabdomyolysis and acute kidney injury on presentation.  Admitted for further management.  Nephrology following for AKI.  Assessment & Plan:   Principal Problem:   ARF (acute renal failure) (HCC) Active Problems:   Rhabdomyolysis   Hyperkalemia   Acute lower UTI   Sepsis (HCC)   Acute kidney injury: Baseline creatinine unknown.  He was taking NSAIDs for chronic arthritis. Acute kidney injury most likely secondary to acute tubular necrosis secondary to rhabdomyolysis.  Patient having good urine output.   IV fluids stopped now .  Expect full recovery.  Nephrology following. Renal ultrasound did not show any hydronephrosis.  Rhabdomyolysis: Secondary to muscle injury due to immobility and  lying on the floor.  Improved.  Suspected sepsis: Presented with lactic acidosis, leukocytosis, elevated procalcitonin on presentation.  Currently hemodynamically stable.  Afebrile.  Urinalysis suggestive  of UTI.  Urine culture showing significant Klebsiella pneumonia and proteus .  Blood cultures have been sent and results are pending.  Continue cefepime for now.  Patient also has significant panniculitis  and ulcerations on the scrotum which donot appear infected. Klebsiella appears to be pansensitive but the susceptibilities for Proteus is pending  Panniculitis/ulceration of the scrotum: Wound care following.  Continue to keep the perineal area dry.  Elevated liver function tests: Most likely associated rhabdomyolysis.  Right upper quadrant ultrasound showed cholelithiasis but there is no evidence of cholecystitis.   Patient denies any abdomen pain.  Hypertension: Poorly controlled.  Home medicines restarted.  Currently blood pressure stable  Chronic arthritis/debility/deconditioning: Wheelchair-bound.  Unable to ambulate since last many years.  Lives in [place].  Has deformity on the hands and bilateral lower extremities.  PT evaluation done and recommended skilled nursing  facility.  Social worker made aware  Complicated renal cyst: Ultrasound showed left complicated renal cyst.  Follow-up as an outpatient with nephrology.     Nutrition Problem: Increased nutrient needs Etiology: wound healing      DVT prophylaxis:Heparin [place] Code Status: Full Family Communication: None present at the bed side Disposition Plan: Home vs SNF after further improvement in the renal function   Consultants: Nephrology  Procedures: None  Antimicrobials:  Anti-infectives (From admission, onward)   Start     Dose/Rate Route Frequency Ordered Stop   03/06/19 2200  ceFEPIme (MAXIPIME) 2 g in sodium chloride 0.9 % 100 mL IVPB     2 g 200 mL/hr over 30 Minutes Intravenous Every 24 hours 03/06/19 0058     03/05/19 2300  vancomycin (VANCOCIN) IVPB 1000 mg/200 mL premix     1,000 mg 200 mL/hr over 60 Minutes Intravenous  Once 03/05/19 2148 03/05/19 2310   03/05/19 2200  vancomycin (VANCOCIN) 2,000 mg in sodium chloride 0.9 % 500 mL IVPB  Status:  Discontinued     2,000 mg 250 mL/hr over 120 Minutes Intravenous  Once 03/05/19 2131 03/05/19 2147   03/05/19 2115  ceFEPIme (MAXIPIME) 2 g in sodium chloride 0.9 % 100 mL IVPB     2 g 200 mL/hr over 30 Minutes Intravenous  Once 03/05/19 2114 03/05/19 2236   03/05/19 2115  vancomycin (VANCOCIN) IVPB  1000 mg/200 mL premix  Status:  Discontinued     1,000 mg 200 mL/hr over 60 Minutes Intravenous  Once 03/05/19 2114 03/05/19 2236      Subjective:  Patient seen and examined the bedside this morning.  Hemodynamically stable.  Having good urine output.  IV fluids will be  discontinued.  He feels better today.  Denies any new complaints.  Objective: Vitals:   03/08/19 0014 03/08/19 0032 03/08/19 0410 03/08/19 0412  BP:  124/89  127/87  Pulse:  (!) 44  (!) 49  Resp:  18  16  Temp: 98.2 F (36.8 C) 98.2 F (36.8 C) 98.1 F (36.7 C) 98.1 F (36.7 C)  TempSrc:  Oral Oral Oral  SpO2:  99%  97%  Weight:   (!) 152.4 kg   Height:        Intake/Output Summary (Last 24 hours) at 03/08/2019 1151 Last data filed at 03/08/2019 0600 Gross per 24 hour  Intake 1673.35 ml  Output 900 ml  Net 773.35 ml   Filed Weights   03/07/19 0429 03/07/19 0800 03/08/19 0410  Weight: (!) 181.9 kg (!) 181.9 kg (!) 152.4 kg    Examination:  General exam:  Not in distress, deconditioned/debilitated HEENT:PERRL,Oral mucosa moist, Ear/Nose normal on gross exam Respiratory system: Bilateral equal air entry, normal vesicular breath sounds, no wheezes or crackles  Cardiovascular system: S1 & S2 heard, RRR. No JVD, murmurs, rubs, gallops or clicks. Gastrointestinal system: Abdomen is distended, soft and nontender. No organomegaly or masses felt. Normal bowel sounds heard. Central nervous system: Alert and oriented. No focal neurological deficits. Extremities:No edema, no clubbing ,no cyanosis, deformities on the hands due to arthritis, deformities on bilateral foot due to prolonged immobility  skin: Ulceration on the abdomen folds, scrotum Psychiatry: Judgement and insight appear normal. Mood & affect appropriate.     Data Reviewed: I have personally reviewed following labs and imaging studies  CBC: Recent Labs  Lab 03/05/19 1820 03/06/19 0720 03/07/19 0304 03/08/19 0339  WBC 19.0* 16.1* 16.2* 15.1*  NEUTROABS 17.1*  --   --  12.3*  HGB 15.0 15.0 13.3 13.5  HCT 46.0 45.8 40.0 38.4*  MCV 96.6 97.4 95.2 93.9  PLT 318 275 285 266   Basic Metabolic Panel: Recent Labs  Lab 03/05/19 1820 03/05/19 2033 03/06/19 0720 03/06/19 1344 03/07/19 0304 03/08/19 0339  NA 142   --  146* 144 140 141  K 5.5*  --  5.0 5.2* 4.4 4.2  CL 105  --  108 110 109 107  CO2 12*  --  16* 18* 17* 22  GLUCOSE 83  --  105* 114* 104* 103*  BUN 82*  --  137* 134* 120* 98*  CREATININE 6.82* 6.67* 6.19* 5.67* 4.97* 3.56*  CALCIUM 8.2*  --  8.3* 8.1* 8.2* 8.6*  PHOS  --   --   --   --  5.7* 4.3   GFR: Estimated Creatinine Clearance: 35.7 mL/min (A) (by C-G formula based on SCr of 3.56 mg/dL (H)). Liver Function Tests: Recent Labs  Lab 03/05/19 2033 03/06/19 0720 03/07/19 0304 03/08/19 0339  AST 102* 77*  --   --   ALT 72* 66*  --   --   ALKPHOS 218* 207*  --   --   BILITOT 2.9* 2.0*  --   --   PROT 7.8 7.5  --   --   ALBUMIN 2.6* 2.6* 2.0* 2.0*   No results for input(s): LIPASE, AMYLASE in the last  168 hours. No results for input(s): AMMONIA in the last 168 hours. Coagulation Profile: Recent Labs  Lab 03/05/19 1820  INR 1.3*   Cardiac Enzymes: Recent Labs  Lab 03/05/19 1820 03/06/19 0720  CKTOTAL 2,155* 1,159*  CKMB  --  55.1*   BNP (last 3 results) No results for input(s): PROBNP in the last 8760 hours. HbA1C: No results for input(s): HGBA1C in the last 72 hours. CBG: No results for input(s): GLUCAP in the last 168 hours. Lipid Profile: No results for input(s): CHOL, HDL, LDLCALC, TRIG, CHOLHDL, LDLDIRECT in the last 72 hours. Thyroid Function Tests: No results for input(s): TSH, T4TOTAL, FREET4, T3FREE, THYROIDAB in the last 72 hours. Anemia Panel: No results for input(s): VITAMINB12, FOLATE, FERRITIN, TIBC, IRON, RETICCTPCT in the last 72 hours. Sepsis Labs: Recent Labs  Lab 03/05/19 1820 03/05/19 2031  PROCALCITON >150.00  --   LATICACIDVEN  --  2.0*    Recent Results (from the past 240 hour(s))  Blood Culture (routine x 2)     Status: None (Preliminary result)   Collection Time: 03/05/19  8:31 PM   Specimen: BLOOD  Result Value Ref Range Status   Specimen Description   Final    BLOOD RIGHT ANTECUBITAL Performed at [facility], [address] [place], [place] [address]    Special Requests   Final    BOTTLES DRAWN AEROBIC AND ANAEROBIC Blood Culture results may not be optimal due to an excessive volume of blood received in culture bottles Performed at [facility], [address] [place], [place] [address]    Culture   Final    NO GROWTH 3 DAYS Performed at [facility], [address] [place], [place] [address]    Report Status PENDING  Incomplete  Blood Culture (routine x 2)     Status: None (Preliminary result)   Collection Time: 03/05/19  8:32 PM   Specimen: BLOOD  Result Value Ref Range Status   Specimen Description   Final    BLOOD LEFT ANTECUBITAL Performed at [facility], [address] [place], [place] [address]    Special Requests   Final    BOTTLES DRAWN AEROBIC AND ANAEROBIC Blood Culture results may not be optimal due to an excessive volume of blood received in culture bottles Performed at [facility], [address] [place], [place] [address]    Culture   Final    NO GROWTH 3 DAYS Performed at [facility], [address] [place], [place] [address]    Report Status PENDING  Incomplete  Urine culture     Status: Abnormal (Preliminary result)   Collection Time: 03/05/19  8:32 PM   Specimen: In/Out Cath Urine  Result Value Ref Range Status   Specimen Description   Final    IN/OUT CATH URINE Performed at [facility], [address] [place], [place] [address]    Special Requests   Final    NONE Performed at [facility], [address] [place], [place] [address]    Culture (A)  Final    >=100,000 COLONIES/mL KLEBSIELLA PNEUMONIAE >=100,000 COLONIES/mL PROTEUS MIRABILIS SUSCEPTIBILITIES TO FOLLOW Performed at [facility], [address] [place], [place] [address]    Report Status PENDING  Incomplete   Organism ID, Bacteria KLEBSIELLA PNEUMONIAE (A)   Final      Susceptibility   Klebsiella pneumoniae - MIC*    AMPICILLIN RESISTANT Resistant  CEFAZOLIN <=4 SENSITIVE Sensitive     CEFTRIAXONE <=1 SENSITIVE Sensitive     CIPROFLOXACIN <=0.25 SENSITIVE Sensitive     GENTAMICIN <=1 SENSITIVE Sensitive     IMIPENEM <=0.25 SENSITIVE Sensitive     NITROFURANTOIN 64 INTERMEDIATE Intermediate     TRIMETH/SULFA <=20 SENSITIVE Sensitive     AMPICILLIN/SULBACTAM <=2 SENSITIVE Sensitive     PIP/TAZO <=4 SENSITIVE Sensitive     Extended ESBL NEGATIVE Sensitive     * >=100,000 COLONIES/mL KLEBSIELLA PNEUMONIAE  SARS Coronavirus 2 [facility] order, Performed in [facility] hospital lab) Nasopharyngeal Nasopharyngeal Swab     Status: None   Collection Time: 03/06/19  4:56 PM   Specimen: Nasopharyngeal Swab  Result Value Ref Range Status   SARS Coronavirus 2 NEGATIVE NEGATIVE Final    Comment: (NOTE) If result is NEGATIVE SARS-CoV-2 target nucleic acids are NOT DETECTED. The SARS-CoV-2 RNA is generally detectable in upper and lower  respiratory specimens during the acute phase of infection. The lowest  concentration of SARS-CoV-2 viral copies this assay can detect is 250  copies / mL. A negative result does not preclude SARS-CoV-2 infection  and should not be used as the sole basis for treatment or other  patient management decisions.  A negative result may occur with  improper specimen collection / handling, submission of specimen other  than nasopharyngeal swab, presence of viral mutation(s) within the  areas targeted by this assay, and inadequate number of viral copies  (<250 copies / mL). A negative result must be combined with clinical  observations, patient history, and epidemiological information. If result is POSITIVE SARS-CoV-2 target nucleic acids are DETECTED. The SARS-CoV-2 RNA is generally detectable in upper and lower  respiratory specimens dur ing the acute phase of infection.  Positive  results are indicative of active  infection with SARS-CoV-2.  Clinical  correlation with patient history and other diagnostic information is  necessary to determine patient infection status.  Positive results do  not rule out bacterial infection or co-infection with other viruses. If result is PRESUMPTIVE POSTIVE SARS-CoV-2 nucleic acids MAY BE PRESENT.   A presumptive positive result was obtained on the submitted specimen  and confirmed on repeat testing.  While 2019 novel coronavirus  (SARS-CoV-2) nucleic acids may be present in the submitted sample  additional confirmatory testing may be necessary for epidemiological  and / or clinical management purposes  to differentiate between  SARS-CoV-2 and other Sarbecovirus currently known to infect humans.  If clinically indicated additional testing with an alternate test  methodology [phone]) is advised. The SARS-CoV-2 RNA is generally  detectable in upper and lower respiratory sp ecimens during the acute  phase of infection. The expected result is Negative. Fact Sheet for Patients:  BoilerBrush.com.cy Fact Sheet for Healthcare Providers: https://pope.com/ This test is not yet approved or cleared by the [place] FDA and has been authorized for detection and/or diagnosis of SARS-CoV-2 by FDA under an Emergency Use Authorization (EUA).  This EUA will remain in effect (meaning this test can be used) for the duration of the COVID-19 declaration under Section 564(b)(1) of the Act, 21 U.S.C. section 360bbb-3(b)(1), unless the authorization is terminated or revoked sooner. Performed at [facility], [address], [place], [place] [address]          Radiology Studies: Nm Hepato W/eject Fract  Result Date: 03/07/2019 CLINICAL DATA:  Abdominal pain.  History of cholelithiasis. EXAM: NUCLEAR MEDICINE HEPATOBILIARY IMAGING WITH GALLBLADDER EF TECHNIQUE: Sequential images of the abdomen were  obtained out  to 60 minutes following intravenous administration of radiopharmaceutical. After oral ingestion of Ensure, gallbladder ejection fraction was determined. At 60 min, normal ejection fraction is greater than 33%. RADIOPHARMACEUTICALS:  4.8 mCi Tc-63m  Choletec IV COMPARISON:  Abdominal ultrasound 03/05/2019 FINDINGS: There is prompt and symmetric uptake in the liver and prompt excretion into the biliary tree which is visualized at 10 minutes. The gallbladder is visualized at 30 minutes. Activity is seen in the small bowel at 10 minutes. Patient ingested 8 ounces of Ensure and a gallbladder ejection fraction was calculated over 60 minutes at 82%. (Normal gallbladder ejection fraction with Ensure is greater than 33%.) IMPRESSION: Normal biliary patency study and normal gallbladder ejection fraction. Electronically Signed   By: Marijo Sanes M.D.   On: 03/07/2019 15:37        Scheduled Meds: . amLODipine  10 mg Oral Daily  . feeding supplement (NEPRO CARB STEADY)  237 mL Oral TID BM  . Gerhardt's butt cream   Topical BID  . heparin  5,000 Units Subcutaneous Q8H  . [START ON 03/09/2019] influenza vac split quadrivalent PF  0.5 mL Intramuscular Tomorrow-1000  . nystatin   Topical TID   Continuous Infusions: . ceFEPime (MAXIPIME) IV 2 g (03/07/19 2222)     LOS: 3 days    Time spent: 35 mins. More than 50% of that time was spent in counseling and/or coordination of care.      Shelly Coss, MD [facility] Pager [phone]  If 7PM-7AM, please contact night-coverage [email or web address] Password [facility] 03/08/2019, 11:51 AM

## 2019-03-08 NOTE — [place] Notes (Signed)
[facility] NEPHROLOGY PROGRESS NOTE  Assessment/ Plan: Pt is a 63 y.o. yo male  with history of chronic pain, neuropathy with wheelchair-bound who was presented to the hospital after being found down on the floor found to have AKI with creatinine level 6.82, CKD 2155 and sepsis.  #Nonoliguric AKI: Exact baseline creatinine level unknown.  AKI likely due to combination of NSAIDs/rhabdomyolysis and sepsis.  UA with UTI.  Ultrasound kidneys with normal echogenicity and without obstruction. -Patient has good urine output and serum creatinine level continue to trend down to 3.56 today.  Acidosis is improving.  I will discontinue IV fluid today.  Increase oral intake.  BUN improving with no sign of uremia.  No indication for dialysis.  Treatment of sepsis per primary team.   #Acidosis due to combination of renal failure and lactic acidosis: Improved.  Discontinue sodium bicarbonate IV.  Monitor lab.  #Hypernatremia: Improved  #Hyperkalemia: Improved, potassium level 4.2 today.  #Sepsis presumed from UTI: On empiric antibiotics.    Urine culture grew Klebsiella sensitive to cefepime.   #Physical deconditioning: PT OT evaluation.  Patient will likely need SNF on discharge.   Subjective: Seen and examined.  No new event.  Denies nausea, vomiting, chest pain, shortness of breath.  Able to lie flat.  Objective Vital signs in last 24 hours: Vitals:   03/08/19 0014 03/08/19 0032 03/08/19 0410 03/08/19 0412  BP:  124/89  127/87  Pulse:  (!) 44  (!) 49  Resp:  18  16  Temp: 98.2 F (36.8 C) 98.2 F (36.8 C) 98.1 F (36.7 C) 98.1 F (36.7 C)  TempSrc:  Oral Oral Oral  SpO2:  99%  97%  Weight:   (!) 152.4 kg   Height:       Weight change: 0 kg  Intake/Output Summary (Last 24 hours) at 03/08/2019 1101 Last data filed at 03/08/2019 0600 Gross per 24 hour  Intake 1673.35 ml  Output 1450 ml  Net 223.35 ml       Labs: Basic Metabolic Panel: Recent Labs  Lab  03/06/19 1344 03/07/19 0304 03/08/19 0339  NA 144 140 141  K 5.2* 4.4 4.2  CL 110 109 107  CO2 18* 17* 22  GLUCOSE 114* 104* 103*  BUN 134* 120* 98*  CREATININE 5.67* 4.97* 3.56*  CALCIUM 8.1* 8.2* 8.6*  PHOS  --  5.7* 4.3   Liver Function Tests: Recent Labs  Lab 03/05/19 2033 03/06/19 0720 03/07/19 0304 03/08/19 0339  AST 102* 77*  --   --   ALT 72* 66*  --   --   ALKPHOS 218* 207*  --   --   BILITOT 2.9* 2.0*  --   --   PROT 7.8 7.5  --   --   ALBUMIN 2.6* 2.6* 2.0* 2.0*   No results for input(s): LIPASE, AMYLASE in the last 168 hours. No results for input(s): AMMONIA in the last 168 hours. CBC: Recent Labs  Lab 03/05/19 1820 03/06/19 0720 03/07/19 0304 03/08/19 0339  WBC 19.0* 16.1* 16.2* 15.1*  NEUTROABS 17.1*  --   --  12.3*  HGB 15.0 15.0 13.3 13.5  HCT 46.0 45.8 40.0 38.4*  MCV 96.6 97.4 95.2 93.9  PLT 318 275 285 266   Cardiac Enzymes: Recent Labs  Lab 03/05/19 1820 03/06/19 0720  CKTOTAL 2,155* 1,159*  CKMB  --  55.1*   CBG: No results for input(s): GLUCAP in the last 168 hours.  Iron Studies: No results for input(s): IRON,  TIBC, TRANSFERRIN, FERRITIN in the last 72 hours. Studies/Results: Nm Hepato W/eject Fract  Result Date: 03/07/2019 CLINICAL DATA:  Abdominal pain.  History of cholelithiasis. EXAM: NUCLEAR MEDICINE HEPATOBILIARY IMAGING WITH GALLBLADDER EF TECHNIQUE: Sequential images of the abdomen were obtained out to 60 minutes following intravenous administration of radiopharmaceutical. After oral ingestion of Ensure, gallbladder ejection fraction was determined. At 60 min, normal ejection fraction is greater than 33%. RADIOPHARMACEUTICALS:  4.8 mCi Tc-66m  Choletec IV COMPARISON:  Abdominal ultrasound 03/05/2019 FINDINGS: There is prompt and symmetric uptake in the liver and prompt excretion into the biliary tree which is visualized at 10 minutes. The gallbladder is visualized at 30 minutes. Activity is seen in the small bowel at 10  minutes. Patient ingested 8 ounces of Ensure and a gallbladder ejection fraction was calculated over 60 minutes at 82%. (Normal gallbladder ejection fraction with Ensure is greater than 33%.) IMPRESSION: Normal biliary patency study and normal gallbladder ejection fraction. Electronically Signed   By: Marijo Sanes M.D.   On: 03/07/2019 15:37    Medications: Infusions: . ceFEPime (MAXIPIME) IV 2 g (03/07/19 2222)    Scheduled Medications: . amLODipine  10 mg Oral Daily  . feeding supplement (NEPRO CARB STEADY)  237 mL Oral TID BM  . Gerhardt's butt cream   Topical BID  . heparin  5,000 Units Subcutaneous Q8H  . [START ON 03/09/2019] influenza vac split quadrivalent PF  0.5 mL Intramuscular Tomorrow-1000  . nystatin   Topical TID    have reviewed scheduled and prn medications.  Physical Exam: General:NAD, comfortable Heart:RRR, s1s2 nl Lungs: Clear b/l, no crackle Abdomen:soft, Non-tender, non-distended Extremities:No edema Neurology: Alert awake and following commands Skin: Multiple ulcers and excoriation around the groin and scrotal area.   Tanna Furry 03/08/2019,11:01 AM  LOS: 3 days  Pager: [phone]

## 2019-03-08 NOTE — Evaluation (Signed)
Physical Therapy Evaluation Patient Details Name: Jacob Garza MRN: [id number] DOB: [birth year] Today's Date: 03/08/2019   History of Present Illness  Patient is a 63 year old male with history of chronic pain syndrome, arthritis, inability to walk and wheelchair-bound who presented to the emergency department after he fell from his wheelchair at a motel and was unable to get up.  Patient was found to have rhabdomyolysis and acute kidney injury on presentation.  Admitted for further management.  Nephrology following for AKI.  Clinical Impression   Pt admitted with above diagnosis. Managing alone at hotel, at wheelchair transfer level prior to admission -- and it seems he had difficulty meeting his mobility, ADL, and hygeine needs; Presents with genralized pain significantly decreasing activity tolerance, arthritic changes throughout hands, significantl pain with any ROM of hips, knees, ankles, and decr skin integrity;  Pt currently with functional limitations due to the deficits listed below (see PT Problem List). Pt will benefit from skilled PT to increase their independence and safety with mobility to allow discharge to the venue listed below.       Follow Up Recommendations SNF;Supervision/Assistance - 24 hour    Equipment Recommendations  Wheelchair (measurements PT);Wheelchair cushion (measurements PT);Hospital bed    Recommendations for Other Services       Precautions / Restrictions Precautions Precautions: Fall Precaution Comments: Premedicate for pain      Mobility  Bed Mobility Overal bed mobility: Needs Assistance Bed Mobility: Rolling Rolling: Max assist;+2 for physical assistance         General bed mobility comments: Cues to reach for rail to assist in rolling; so hesitant to move due to anticipation of pain; requires Max assist to support top LE in rolling, and then in sidelying  Transfers                    Ambulation/Gait                 Stairs            Wheelchair Mobility    Modified Rankin (Stroke Patients Only)       Balance                                             Pertinent Vitals/Pain Pain Assessment: Faces Faces Pain Scale: Hurts whole lot Pain Location: Bil LEs, proximal posterior thigh and perineal/scrotal wounds; Significant pain with any mobiltiy Pain Descriptors / Indicators: Grimacing;Guarding;Crying Pain Intervention(s): RN gave pain meds during session;Repositioned    Home Living Family/patient expects to be discharged to:: Private residence Living Arrangements: Alone Available Help at Discharge: Friend(s)(Tells me people bring him food) Type of Home: Other(Comment)(hotel room) Home Access: Level entry     Home Layout: One level   Additional Comments: He is unclear in his answers when asked about how he manages bathing; Noting the locations of his wounds, it seems that bathing and hygeine needs have been unmet    Prior Function Level of Independence: Independent with assistive device(s)         Comments: While he has lived alone, and therefore has been independent, it seems his bathing/hygeine needs have been unmet; He uses a wheelchair primarily fo rmobility, and describes transfers lately as "very hard"     Hand Dominance        Extremity/Trunk Assessment   Upper Extremity Assessment Upper Extremity Assessment: (adequate  shoulder and elbo ROM to be able to reach across midline for bedrail to help with Rolling R and L; noting arthritic changes bilateral hands, difficulty with gross grip and gross finger extension)    Lower Extremity Assessment Lower Extremity Assessment: RLE deficits/detail;LLE deficits/detail RLE Deficits / Details: Overall stiffness in knees and ankles; Noting volitional initiation of lifting LEs against gravity, but stops due to pain; did not tolerate attempts at ankel and knee PROM RLE: Unable to fully assess due to pain LLE  Deficits / Details: Overall stiffness in knees and ankles; Noting volitional initiation of lifting LEs against gravity, but stops due to pain; did not tolerate attempts at ankel and knee PROM LLE: Unable to fully assess due to pain       Communication   Communication: No difficulties  Cognition Arousal/Alertness: Awake/alert Behavior During Therapy: WFL for tasks assessed/performed Overall Cognitive Status: Impaired/Different from baseline Area of Impairment: Attention                               General Comments: Extremely internally distracted at the anticipation of pain      General Comments General comments (skin integrity, edema, etc.): RN and Nurse Tech in room, assisting with changing bed linens and tending to posterior thigh wounds    Exercises     Assessment/Plan    PT Assessment Patient needs continued PT services  PT Problem List Decreased strength;Decreased range of motion;Decreased activity tolerance;Decreased balance;Decreased mobility;Decreased knowledge of use of DME;Decreased knowledge of precautions;Obesity;Decreased skin integrity;Pain       PT Treatment Interventions DME instruction;Functional mobility training;Therapeutic activities;Therapeutic exercise;Balance training;Cognitive remediation;Patient/family education;Wheelchair mobility training    PT Goals (Current goals can be found in the Care Plan section)  Acute Rehab PT Goals Patient Stated Goal: be as independent as possible PT Goal Formulation: With patient Time For Goal Achievement: 03/22/19 Potential to Achieve Goals: Fair    Frequency Min 2X/week   Barriers to discharge        Co-evaluation               AM-PAC PT "6 Clicks" Mobility  Outcome Measure Help needed turning from your back to your side while in a flat bed without using bedrails?: A Lot Help needed moving from lying on your back to sitting on the side of a flat bed without using bedrails?: Total Help  needed moving to and from a bed to a chair (including a wheelchair)?: Total Help needed standing up from a chair using your arms (e.g., wheelchair or bedside chair)?: Total Help needed to walk in hospital room?: Total Help needed climbing 3-5 steps with a railing? : Total 6 Click Score: 7    End of Session   Activity Tolerance: Patient limited by pain Patient left: in bed;with call bell/phone within reach;with nursing/sitter in room Nurse Communication: Mobility status;Need for lift equipment PT Visit Diagnosis: Muscle weakness (generalized) (M62.81);History of falling (Z91.81);Pain Pain - Right/Left: (bilateral ) Pain - part of body: Leg    Time: 5188-4166 PT Time Calculation (min) (ACUTE ONLY): 42 min   Charges:   PT Evaluation $PT Eval Moderate Complexity: 1 Mod PT Treatments $Therapeutic Activity: 23-37 mins        Roney Marion, PT  Acute Rehabilitation Services Pager [phone] Office [phone]   [place] 03/08/2019, 10:56 AM

## 2019-03-08 NOTE — TOC Progression Note (Cosign Needed)
Transition of Care [facility]) - Progression Note    Patient Details  Name: Jacob Garza MRN: [id number] Date of Birth: Apr 09, 1956  [facility] CM/SW [place], [facility] Work Phone Number: 03/08/2019, 3:13 PM  Clinical Narrative:    CSW spoke with pt about bed offers. Pt was agreeable to go to [facility]. Pt reported to be very worried about transporting due to not having his belongings at the hospital. Pt also stated that he left all his clothes, shoes, socks, etc at the [facility] in which he resides. Pt has no clothes or any other belongings at the hospital. Pt stated that he paid rent for the month. CSW will follow up with [facility] and ask if they can get his belongings for him. Per Ebony Hail at [facility] pt will not require a new COVID. CSW will continue to follow up.    Expected Discharge Plan: [place] Barriers to Discharge: Continued Medical Work up  Expected Discharge Plan and Services Expected Discharge Plan: [place] In-house Referral: Clinical Social Work Discharge Planning Services: CM Consult   Living arrangements for the past 2 months: Hotel/Motel([facility] on [address])                                       Social Determinants of Health (SDOH) Interventions    Readmission Risk Interventions No flowsheet data found.

## 2019-03-09 LAB — CBC WITH DIFFERENTIAL/PLATELET
Abs Immature Granulocytes: 0.77 10*3/uL — ABNORMAL HIGH (ref 0.00–0.07)
Basophils Absolute: 0 10*3/uL (ref 0.0–0.1)
Basophils Relative: 0 %
Eosinophils Absolute: 0.1 10*3/uL (ref 0.0–0.5)
Eosinophils Relative: 0 %
HCT: 37 % — ABNORMAL LOW (ref 39.0–52.0)
Hemoglobin: 12.3 g/dL — ABNORMAL LOW (ref 13.0–17.0)
Immature Granulocytes: 6 %
Lymphocytes Relative: 10 %
Lymphs Abs: 1.4 10*3/uL (ref 0.7–4.0)
MCH: 32.1 pg (ref 26.0–34.0)
MCHC: 33.2 g/dL (ref 30.0–36.0)
MCV: 96.6 fL (ref 80.0–100.0)
Monocytes Absolute: 0.8 10*3/uL (ref 0.1–1.0)
Monocytes Relative: 6 %
Neutro Abs: 10.9 10*3/uL — ABNORMAL HIGH (ref 1.7–7.7)
Neutrophils Relative %: 78 %
Platelets: 270 10*3/uL (ref 150–400)
RBC: 3.83 MIL/uL — ABNORMAL LOW (ref 4.22–5.81)
RDW: 14.5 % (ref 11.5–15.5)
WBC: 14 10*3/uL — ABNORMAL HIGH (ref 4.0–10.5)
nRBC: 0 % (ref 0.0–0.2)

## 2019-03-09 LAB — CK: Total CK: 185 U/L (ref 49–397)

## 2019-03-09 LAB — BASIC METABOLIC PANEL
Anion gap: 12 (ref 5–15)
BUN: 79 mg/dL — ABNORMAL HIGH (ref 6–20)
CO2: 27 mmol/L (ref 22–32)
Calcium: 8.6 mg/dL — ABNORMAL LOW (ref 8.9–10.3)
Chloride: 103 mmol/L (ref 98–111)
Creatinine, Ser: 2.99 mg/dL — ABNORMAL HIGH (ref 0.61–1.24)
GFR calc Af Amer: 25 mL/min — ABNORMAL LOW (ref >60)
GFR calc non Af Amer: 22 mL/min — ABNORMAL LOW (ref >60)
Glucose, Bld: 110 mg/dL — ABNORMAL HIGH (ref 70–99)
Potassium: 3.8 mmol/L (ref 3.5–5.1)
Sodium: 142 mmol/L (ref 135–145)

## 2019-03-09 LAB — URINE CULTURE: Culture: >=100000 — AB

## 2019-03-09 MED ORDER — GERHARDT'S BUTT CREAM: 1.0000 "application " | TOPICAL_CREAM | Freq: Two times a day (BID) | CUTANEOUS | 0 refills | Status: AC

## 2019-03-09 MED ORDER — TRAMADOL HCL 50 MG PO TABS: 50.0000 mg | ORAL_TABLET | Freq: Four times a day (QID) | ORAL | 0 refills | Status: DC | PRN

## 2019-03-09 MED ORDER — CEPHALEXIN 500 MG PO CAPS
500.0000 mg | ORAL_CAPSULE | Freq: Two times a day (BID) | ORAL | Status: DC
  Administered 2019-03-09: 500 mg via ORAL
  Filled 2019-03-09: qty 1

## 2019-03-09 MED ORDER — CEPHALEXIN 500 MG PO CAPS: 500.0000 mg | ORAL_CAPSULE | Freq: Two times a day (BID) | ORAL | 0 refills | Status: AC

## 2019-03-09 MED ORDER — NYSTATIN 100000 UNIT/GM EX POWD: Freq: Three times a day (TID) | CUTANEOUS | 0 refills | Status: AC

## 2019-03-09 NOTE — Discharge Summary (Signed)
Physician Discharge Summary  Jacob Garza ZOX:[id number] DOB: 05-22-1956 DOA: 03/05/2019  PCP: Patient, No Pcp Per  Admit date: 03/05/2019 Discharge date: 03/09/2019  Admitted From: Home Disposition:  Home  Discharge Condition:Stable CODE STATUS:FULL Diet recommendation: Heart Healthy  Brief/Interim Summary:  Patient is a 63 year old male with history of chronic pain syndrome, arthritis, inability to walk and wheelchair-bound who presented to the emergency department after he fell [place] his [place] a motel and was unable to get up.  Patient was found to have rhabdomyolysis and acute kidney injury on presentation.  Admitted for further management.  Nephrology was following for AKI.  His kidney function improved with IV fluids.  Patient about by physical therapy and recommended skilled nursing facility on discharge.  Following problems were addressed during his hospitalization:   Acute kidney injury: Baseline creatinine unknown.  He was taking NSAIDs for chronic arthritis. Acute kidney injury most likely secondary to acute tubular necrosis secondary to rhabdomyolysis.  Patient having good urine output.   IV fluids stopped now .  Expect full recovery.  Nephrology following. Renal ultrasound did not show any hydronephrosis. Follow up BMP in a week.  Rhabdomyolysis: Secondary to muscle injury due to immobility and  lying on the floor.  Improved.  Suspected sepsis: Presented with lactic acidosis, leukocytosis, elevated procalcitonin on presentation.  Currently hemodynamically stable.  Afebrile.  Urinalysis suggestive  of UTI.  Urine culture showing significant Klebsiella pneumonia and proteus .  Blood cultures have been sent and did not show any growth till date.   Patient also has significant panniculitis  and ulcerations on the scrotum which donot appear infected. Klebsiella,proteus  appears to be pansensitive, antibiotics changed to oral.  Panniculitis/ulceration of the scrotum:  Wound care was following.  Continue to keep the perineal area dry.  Elevated liver function tests: Most likely associated rhabdomyolysis.  Right upper quadrant ultrasound showed cholelithiasis but there is no evidence of cholecystitis.  Patient denies any abdomen pain.  Hypertension: Poorly controlled.  Home medicines restarted.  Currently blood pressure stable  Chronic arthritis/debility/deconditioning: Wheelchair-bound.  Unable to ambulate since last many years.  [place] [employer].  Has deformity on the hands and bilateral lower extremities.  PT evaluation done and recommended skilled nursing  facility.    Complicated renal cyst: Ultrasound showed left complicated renal cyst.  Follow-up as an outpatient with nephrology.    Discharge Diagnoses:  Principal Problem:   ARF (acute renal failure) (HCC) Active Problems:   Rhabdomyolysis   Hyperkalemia   Acute lower UTI   Sepsis [facility](HCC)    Discharge Instructions  Discharge Instructions    Diet - low sodium heart healthy   Complete by: As directed    Discharge instructions   Complete by: As directed    1)Please take prescribed medications as instructed. 2)Do a CBC and BMP tests in a week.   Increase activity slowly   Complete by: As directed      Allergies as of 03/09/2019   No Known Allergies     Medication List    STOP taking these medications   acetaminophen 500 MG tablet Commonly known as: TYLENOL   naproxen 375 MG tablet Commonly known as: NAPROSYN     TAKE these medications   amLODipine 10 MG tablet Commonly known as: NORVASC Take 1 tablet (10 mg total) by mouth daily.   cephALEXin 500 MG capsule Commonly known as: KEFLEX Take 1 capsule (500 mg total) by mouth every 12 (twelve) hours for 3 days.   Gerhardt's  butt cream Crea Apply 1 application topically 2 (two) times daily.   naproxen sodium 220 MG tablet Commonly known as: ALEVE Take 220 mg by mouth 2 (two) times daily as needed (pain).   nystatin  powder Commonly known as: MYCOSTATIN/NYSTOP Apply topically 3 (three) times daily.   traMADol 50 MG tablet Commonly known as: ULTRAM Take 1 tablet (50 mg total) by mouth every 6 (six) hours as needed.       No Known Allergies  Consultations:  Nephrology   Procedures/Studies: US Renal  Result Date: 03/05/2019 CLINICAL DATA:  Acute kidney injury EXAM: RENAL / URINARY TRACT ULTRASOUND COMPLETE COMPARISON:  None. FINDINGS: Right Kidney: Renal measurements: 11.2 x 6.8 x 6.6 cm = volume: 239.3 mL . Echogenicity within normal limits. No mass or hydronephrosis visualized. Left Kidney: Renal measurements: 10.6 x 6.5 x 5.3 cm = volume: 189.6 mL. Cortical echogenicity normal. No hydronephrosis. Cyst containing scattered echoes off lower pole left kidney measuring 3.3 x 3.6 x 3.6 cm. Bladder: Appears normal for degree of bladder distention. IMPRESSION: 1. Negative for hydronephrosis. 2. Slightly complicated cyst inferior left kidney Electronically Signed   By: Jasmine Pang M.D.   On: 03/05/2019 22:38   Nm Hepato W/eject Fract  Result Date: 03/07/2019 CLINICAL DATA:  Abdominal pain.  History of cholelithiasis. EXAM: NUCLEAR MEDICINE HEPATOBILIARY IMAGING WITH GALLBLADDER EF TECHNIQUE: Sequential images of the abdomen were obtained out to 60 minutes following intravenous administration of radiopharmaceutical. After oral ingestion of Ensure, gallbladder ejection fraction was determined. At 60 min, normal ejection fraction is greater than 33%. RADIOPHARMACEUTICALS:  4.8 mCi Tc-72m  Choletec IV COMPARISON:  Abdominal ultrasound 03/05/2019 FINDINGS: There is prompt and symmetric uptake in the liver and prompt excretion into the biliary tree which is visualized at 10 minutes. The gallbladder is visualized at 30 minutes. Activity is seen in the small bowel at 10 minutes. Patient ingested 8 ounces of Ensure and a gallbladder ejection fraction was calculated over 60 minutes at 82%. (Normal gallbladder ejection  fraction with Ensure is greater than 33%.) IMPRESSION: Normal biliary patency study and normal gallbladder ejection fraction. Electronically Signed   By: Rudie Meyer M.D.   On: 03/07/2019 15:37   Dg Chest Port 1 View  Result Date: 03/05/2019 CLINICAL DATA:  Sepsis EXAM: PORTABLE CHEST 1 VIEW COMPARISON:  None. FINDINGS: The heart size and mediastinal contours are within normal limits. Both lungs are clear. The visualized skeletal structures are unremarkable. IMPRESSION: No acute abnormality of the lungs in AP portable projection. Electronically Signed   By: Lauralyn Primes M.D.   On: 03/05/2019 19:44   US Abdomen Limited Ruq  Result Date: 03/05/2019 CLINICAL DATA:  Abnormal liver function test EXAM: ULTRASOUND ABDOMEN LIMITED RIGHT UPPER QUADRANT COMPARISON:  None. FINDINGS: Gallbladder: Diffuse shadowing within the gallbladder presumably due to stone filled gallbladder. Increased wall thickness 4 mm. Negative sonographic Murphy Common bile duct: Diameter: 3.8 mm Liver: Heterogeneous liver echotexture with areas of increased echogenicity. No discrete mass. Portal vein is patent on color Doppler imaging with normal direction of blood flow towards the liver. Other: None. IMPRESSION: 1. Diffuse shadowing within the gallbladder, presumably due to stone filled gallbladder. Nonspecific mild increased wall thickness, could be secondary to acute or chronic cholecystitis, or liver disease. Sonographic Eulah Pont was negative. If further evaluation is required, consider correlation with nuclear medicine hepatobiliary imaging 2. Heterogeneous liver with areas of increased hepatic echogenicity, suspect that there may be heterogenous fat infiltration. Electronically Signed   By: Adrian Prows.D.  On: 03/05/2019 23:31       Subjective: Patient seen and examined at bedside this morning.  Hemodynamically stable for discharge.  Discharge Exam: Vitals:   03/09/19 0806 03/09/19 0807  BP: (!) 124/95 (!) 124/95   Pulse: 92 92  Resp: (!) 22 19  Temp: 98.9 F (37.2 C) 98.9 F (37.2 C)  SpO2:     Vitals:   03/09/19 0406 03/09/19 0407 03/09/19 0806 03/09/19 0807  BP:   (!) 124/95 (!) 124/95  Pulse:   92 92  Resp:   (!) 22 19  Temp: 99.7 F (37.6 C)  98.9 F (37.2 C) 98.9 F (37.2 C)  TempSrc: Oral  Axillary Axillary  SpO2:      Weight:  (!) 152.4 kg    Height:        General: Pt is alert, awake, not in acute distress Cardiovascular: RRR, S1/S2 +, no rubs, no gallops Respiratory: CTA bilaterally, no wheezing, no rhonchi Abdominal: Soft, NT, ND, bowel sounds + Extremities: no edema, no cyanosis    The results of significant diagnostics from this hospitalization (including imaging, microbiology, ancillary and laboratory) are listed below for reference.     Microbiology: Recent Results (from the past 240 hour(s))  Blood Culture (routine x 2)     Status: None (Preliminary result)   Collection Time: 03/05/19  8:31 PM   Specimen: BLOOD  Result Value Ref Range Status   Specimen Description   Final    BLOOD RIGHT ANTECUBITAL Performed at [facility], [address] [place], [place] [address]    Special Requests   Final    BOTTLES DRAWN AEROBIC AND ANAEROBIC Blood Culture results may not be optimal due to an excessive volume of blood received in culture bottles Performed at [facility], [address] [place], [place] [address]    Culture   Final    NO GROWTH 3 DAYS Performed at [facility], [address] [place], [place] [address]    Report Status PENDING  Incomplete  Blood Culture (routine x 2)     Status: None (Preliminary result)   Collection Time: 03/05/19  8:32 PM   Specimen: BLOOD  Result Value Ref Range Status   Specimen Description   Final    BLOOD LEFT ANTECUBITAL Performed at [facility], [address] [place], [place] [address]    Special Requests   Final    BOTTLES DRAWN AEROBIC AND ANAEROBIC  Blood Culture results may not be optimal due to an excessive volume of blood received in culture bottles Performed at [facility], [address] [place], [place] [address]    Culture   Final    NO GROWTH 3 DAYS Performed at [facility], [address] [place], [place] [address]    Report Status PENDING  Incomplete  Urine culture     Status: Abnormal   Collection Time: 03/05/19  8:32 PM   Specimen: In/Out Cath Urine  Result Value Ref Range Status   Specimen Description   Final    IN/OUT CATH URINE Performed at [facility], [address] [place], [place] [address]    Special Requests   Final    NONE Performed at [facility], [address] [place], [place] [address]    Culture (A)  Final    >=100,000 COLONIES/mL KLEBSIELLA PNEUMONIAE >=100,000 COLONIES/mL PROTEUS MIRABILIS    Report Status 03/09/2019 FINAL  Final  Organism ID, Bacteria KLEBSIELLA PNEUMONIAE (A)  Final   Organism ID, Bacteria PROTEUS MIRABILIS (A)  Final      Susceptibility   Klebsiella pneumoniae - MIC*    AMPICILLIN RESISTANT Resistant     CEFAZOLIN <=4 SENSITIVE Sensitive     CEFTRIAXONE <=1 SENSITIVE Sensitive     CIPROFLOXACIN <=0.25 SENSITIVE Sensitive     GENTAMICIN <=1 SENSITIVE Sensitive     IMIPENEM <=0.25 SENSITIVE Sensitive     NITROFURANTOIN 64 INTERMEDIATE Intermediate     TRIMETH/SULFA <=20 SENSITIVE Sensitive     AMPICILLIN/SULBACTAM <=2 SENSITIVE Sensitive     PIP/TAZO <=4 SENSITIVE Sensitive     Extended ESBL NEGATIVE Sensitive     * >=100,000 COLONIES/mL KLEBSIELLA PNEUMONIAE   Proteus mirabilis - MIC*    AMPICILLIN <=2 SENSITIVE Sensitive     CEFAZOLIN <=4 SENSITIVE Sensitive     CEFTRIAXONE <=1 SENSITIVE Sensitive     CIPROFLOXACIN <=0.25 SENSITIVE Sensitive     GENTAMICIN <=1 SENSITIVE Sensitive     IMIPENEM <=0.25 SENSITIVE Sensitive     NITROFURANTOIN 128 RESISTANT Resistant     TRIMETH/SULFA <=20 SENSITIVE  Sensitive     AMPICILLIN/SULBACTAM <=2 SENSITIVE Sensitive     PIP/TAZO <=4 SENSITIVE Sensitive     * >=100,000 COLONIES/mL PROTEUS MIRABILIS  SARS Coronavirus 2 [facility] order, Performed in [facility] hospital lab) Nasopharyngeal Nasopharyngeal Swab     Status: None   Collection Time: 03/06/19  4:56 PM   Specimen: Nasopharyngeal Swab  Result Value Ref Range Status   SARS Coronavirus 2 NEGATIVE NEGATIVE Final    Comment: (NOTE) If result is NEGATIVE SARS-CoV-2 target nucleic acids are NOT DETECTED. The SARS-CoV-2 RNA is generally detectable in upper and lower  respiratory specimens during the acute phase of infection. The lowest  concentration of SARS-CoV-2 viral copies this assay can detect is 250  copies / mL. A negative result does not preclude SARS-CoV-2 infection  and should not be used as the sole basis for treatment or other  patient management decisions.  A negative result may occur with  improper specimen collection / handling, submission of specimen other  than nasopharyngeal swab, presence of viral mutation(s) within the  areas targeted by this assay, and inadequate number of viral copies  (<250 copies / mL). A negative result must be combined with clinical  observations, patient history, and epidemiological information. If result is POSITIVE SARS-CoV-2 target nucleic acids are DETECTED. The SARS-CoV-2 RNA is generally detectable in upper and lower  respiratory specimens dur ing the acute phase of infection.  Positive  results are indicative of active infection with SARS-CoV-2.  Clinical  correlation with patient history and other diagnostic information is  necessary to determine patient infection status.  Positive results do  not rule out bacterial infection or co-infection with other viruses. If result is PRESUMPTIVE POSTIVE SARS-CoV-2 nucleic acids MAY BE PRESENT.   A presumptive positive result was obtained on the submitted specimen  and confirmed on repeat  testing.  While 2019 novel coronavirus  (SARS-CoV-2) nucleic acids may be present in the submitted sample  additional confirmatory testing may be necessary for epidemiological  and / or clinical management purposes  to differentiate between  SARS-CoV-2 and other Sarbecovirus currently known to infect humans.  If clinically indicated additional testing with an alternate test  methodology [phone]) is advised. The SARS-CoV-2 RNA is generally  detectable in upper and lower respiratory sp ecimens during the acute  phase of infection. The expected result is Negative. Fact Sheet  for Patients:  BoilerBrush.com.cy Fact Sheet for Healthcare Providers: https://pope.com/ This test is not yet approved or cleared by the [place] FDA and has been authorized for detection and/or diagnosis of SARS-CoV-2 by FDA under an Emergency Use Authorization (EUA).  This EUA will remain in effect (meaning this test can be used) for the duration of the COVID-19 declaration under Section 564(b)(1) of the Act, 21 U.S.C. section 360bbb-3(b)(1), unless the authorization is terminated or revoked sooner. Performed at [facility], [address], [place], [place] [address]      Labs: BNP (last 3 results) No results for input(s): BNP in the last 8760 hours. Basic Metabolic Panel: Recent Labs  Lab 03/06/19 0720 03/06/19 1344 03/07/19 0304 03/08/19 0339 03/09/19 0237  NA 146* 144 140 141 142  K 5.0 5.2* 4.4 4.2 3.8  CL 108 110 109 107 103  CO2 16* 18* 17* 22 27  GLUCOSE 105* 114* 104* 103* 110*  BUN 137* 134* 120* 98* 79*  CREATININE 6.19* 5.67* 4.97* 3.56* 2.99*  CALCIUM 8.3* 8.1* 8.2* 8.6* 8.6*  PHOS  --   --  5.7* 4.3  --    Liver Function Tests: Recent Labs  Lab 03/05/19 2033 03/06/19 0720 03/07/19 0304 03/08/19 0339  AST 102* 77*  --   --   ALT 72* 66*  --   --   ALKPHOS 218* 207*  --   --   BILITOT 2.9* 2.0*  --   --    PROT 7.8 7.5  --   --   ALBUMIN 2.6* 2.6* 2.0* 2.0*   No results for input(s): LIPASE, AMYLASE in the last 168 hours. No results for input(s): AMMONIA in the last 168 hours. CBC: Recent Labs  Lab 03/05/19 1820 03/06/19 0720 03/07/19 0304 03/08/19 0339 03/09/19 0237  WBC 19.0* 16.1* 16.2* 15.1* 14.0*  NEUTROABS 17.1*  --   --  12.3* 10.9*  HGB 15.0 15.0 13.3 13.5 12.3*  HCT 46.0 45.8 40.0 38.4* 37.0*  MCV 96.6 97.4 95.2 93.9 96.6  PLT 318 275 285 266 270   Cardiac Enzymes: Recent Labs  Lab 03/05/19 1820 03/06/19 0720 03/09/19 0237  CKTOTAL 2,155* 1,159* 185  CKMB  --  55.1*  --    BNP: Invalid input(s): POCBNP CBG: No results for input(s): GLUCAP in the last 168 hours. D-Dimer No results for input(s): DDIMER in the last 72 hours. Hgb A1c No results for input(s): HGBA1C in the last 72 hours. Lipid Profile No results for input(s): CHOL, HDL, LDLCALC, TRIG, CHOLHDL, LDLDIRECT in the last 72 hours. Thyroid function studies No results for input(s): TSH, T4TOTAL, T3FREE, THYROIDAB in the last 72 hours.  Invalid input(s): FREET3 Anemia work up No results for input(s): VITAMINB12, FOLATE, FERRITIN, TIBC, IRON, RETICCTPCT in the last 72 hours. Urinalysis    Component Value Date/Time   COLORURINE AMBER (A) 03/05/2019 2031   APPEARANCEUR CLOUDY (A) 03/05/2019 2031   LABSPEC 1.012 03/05/2019 2031   PHURINE 6.0 03/05/2019 2031   GLUCOSEU NEGATIVE 03/05/2019 2031   HGBUR LARGE (A) 03/05/2019 2031   BILIRUBINUR NEGATIVE 03/05/2019 2031   KETONESUR 5 (A) 03/05/2019 2031   PROTEINUR 100 (A) 03/05/2019 2031   NITRITE NEGATIVE 03/05/2019 2031   LEUKOCYTESUR LARGE (A) 03/05/2019 2031   Sepsis Labs Invalid input(s): PROCALCITONIN,  WBC,  LACTICIDVEN Microbiology Recent Results (from the past 240 hour(s))  Blood Culture (routine x 2)     Status: None (Preliminary result)   Collection Time: 03/05/19  8:31 PM  Specimen: BLOOD  Result Value Ref Range Status   Specimen  Description   Final    BLOOD RIGHT ANTECUBITAL Performed at [facility], [address], [place], [place] [address]    Special Requests   Final    BOTTLES DRAWN AEROBIC AND ANAEROBIC Blood Culture results may not be optimal due to an excessive volume of blood received in culture bottles Performed at [facility], [address], [place], [place] [address]    Culture   Final    NO GROWTH 3 DAYS Performed at [facility], [address] [place], [place] [address]    Report Status PENDING  Incomplete  Blood Culture (routine x 2)     Status: None (Preliminary result)   Collection Time: 03/05/19  8:32 PM   Specimen: BLOOD  Result Value Ref Range Status   Specimen Description   Final    BLOOD LEFT ANTECUBITAL Performed at [facility], [address] [place], [place] [address]    Special Requests   Final    BOTTLES DRAWN AEROBIC AND ANAEROBIC Blood Culture results may not be optimal due to an excessive volume of blood received in culture bottles Performed at [facility], [address], [place], [place] [address]    Culture   Final    NO GROWTH 3 DAYS Performed at [facility], [address] [place], [place] [address]    Report Status PENDING  Incomplete  Urine culture     Status: Abnormal   Collection Time: 03/05/19  8:32 PM   Specimen: In/Out Cath Urine  Result Value Ref Range Status   Specimen Description   Final    IN/OUT CATH URINE Performed at [facility], [address] [place], [place] [address]    Special Requests   Final    NONE Performed at [facility], [address], [place], [place] [address]    Culture (A)  Final    >=100,000 COLONIES/mL KLEBSIELLA PNEUMONIAE >=100,000 COLONIES/mL PROTEUS MIRABILIS    Report Status 03/09/2019 FINAL  Final   Organism ID, Bacteria KLEBSIELLA PNEUMONIAE (A)  Final   Organism ID, Bacteria PROTEUS  MIRABILIS (A)  Final      Susceptibility   Klebsiella pneumoniae - MIC*    AMPICILLIN RESISTANT Resistant     CEFAZOLIN <=4 SENSITIVE Sensitive     CEFTRIAXONE <=1 SENSITIVE Sensitive     CIPROFLOXACIN <=0.25 SENSITIVE Sensitive     GENTAMICIN <=1 SENSITIVE Sensitive     IMIPENEM <=0.25 SENSITIVE Sensitive     NITROFURANTOIN 64 INTERMEDIATE Intermediate     TRIMETH/SULFA <=20 SENSITIVE Sensitive     AMPICILLIN/SULBACTAM <=2 SENSITIVE Sensitive     PIP/TAZO <=4 SENSITIVE Sensitive     Extended ESBL NEGATIVE Sensitive     * >=100,000 COLONIES/mL KLEBSIELLA PNEUMONIAE   Proteus mirabilis - MIC*    AMPICILLIN <=2 SENSITIVE Sensitive     CEFAZOLIN <=4 SENSITIVE Sensitive     CEFTRIAXONE <=1 SENSITIVE Sensitive     CIPROFLOXACIN <=0.25 SENSITIVE Sensitive     GENTAMICIN <=1 SENSITIVE Sensitive     IMIPENEM <=0.25 SENSITIVE Sensitive     NITROFURANTOIN 128 RESISTANT Resistant     TRIMETH/SULFA <=20 SENSITIVE Sensitive     AMPICILLIN/SULBACTAM <=2 SENSITIVE Sensitive     PIP/TAZO <=4 SENSITIVE Sensitive     * >=100,000 COLONIES/mL PROTEUS MIRABILIS  SARS Coronavirus 2 [facility] order, Performed in [facility] hospital lab) Nasopharyngeal Nasopharyngeal  Swab     Status: None   Collection Time: 03/06/19  4:56 PM   Specimen: Nasopharyngeal Swab  Result Value Ref Range Status   SARS Coronavirus 2 NEGATIVE NEGATIVE Final    Comment: (NOTE) If result is NEGATIVE SARS-CoV-2 target nucleic acids are NOT DETECTED. The SARS-CoV-2 RNA is generally detectable in upper and lower  respiratory specimens during the acute phase of infection. The lowest  concentration of SARS-CoV-2 viral copies this assay can detect is 250  copies / mL. A negative result does not preclude SARS-CoV-2 infection  and should not be used as the sole basis for treatment or other  patient management decisions.  A negative result may occur with  improper specimen collection / handling, submission of specimen other  than  nasopharyngeal swab, presence of viral mutation(s) within the  areas targeted by this assay, and inadequate number of viral copies  (<250 copies / mL). A negative result must be combined with clinical  observations, patient history, and epidemiological information. If result is POSITIVE SARS-CoV-2 target nucleic acids are DETECTED. The SARS-CoV-2 RNA is generally detectable in upper and lower  respiratory specimens dur ing the acute phase of infection.  Positive  results are indicative of active infection with SARS-CoV-2.  Clinical  correlation with patient history and other diagnostic information is  necessary to determine patient infection status.  Positive results do  not rule out bacterial infection or co-infection with other viruses. If result is PRESUMPTIVE POSTIVE SARS-CoV-2 nucleic acids MAY BE PRESENT.   A presumptive positive result was obtained on the submitted specimen  and confirmed on repeat testing.  While 2019 novel coronavirus  (SARS-CoV-2) nucleic acids may be present in the submitted sample  additional confirmatory testing may be necessary for epidemiological  and / or clinical management purposes  to differentiate between  SARS-CoV-2 and other Sarbecovirus currently known to infect humans.  If clinically indicated additional testing with an alternate test  methodology [phone]) is advised. The SARS-CoV-2 RNA is generally  detectable in upper and lower respiratory sp ecimens during the acute  phase of infection. The expected result is Negative. Fact Sheet for Patients:  StrictlyIdeas.no Fact Sheet for Healthcare Providers: BankingDealers.co.za This test is not yet approved or cleared by the [place] FDA and has been authorized for detection and/or diagnosis of SARS-CoV-2 by FDA under an Emergency Use Authorization (EUA).  This EUA will remain in effect (meaning this test can be used) for the duration of  the COVID-19 declaration under Section 564(b)(1) of the Act, 21 U.S.C. section 360bbb-3(b)(1), unless the authorization is terminated or revoked sooner. Performed at [facility], [place] [address] [place],  [address]     Please note: You were cared for by a hospitalist during your hospital stay. Once you are discharged, your primary care physician will handle any further medical issues. Please note that NO REFILLS for any discharge medications will be authorized once you are discharged, as it is imperative that you return to your primary care physician (or establish a relationship with a primary care physician if you do not have one) for your post hospital discharge needs so that they can reassess your need for medications and monitor your lab values.    Time coordinating discharge: 40 minutes  SIGNED:   Shelly Coss, MD  [facility] 03/09/2019, 10:38 AM Pager [phone]  If 7PM-7AM, please contact night-coverage [email or web address] Password [email or web address]

## 2019-03-09 NOTE — Progress Notes (Signed)
Patient was discharged to SNF via stretcher by [facility]; discharge instructions and prescriptions sent with [facility]; IV DIC; Pt in agreement with transfer.

## 2019-03-09 NOTE — Progress Notes (Signed)
Pharmacy Antibiotic Note  Jacob Garza is a 63 y.o. male wheelchair bound found in floor after 2 days admitted on 03/05/2019 with sepsis 2/2 UTI.  Pharmacy has been consulted for cefepime dosing. Sensitivites for klebsiella and proteus have returned as S to cefazolin. D/W Dr. Tawanna Solo, patient going home today. Will change to PO Keflex to complete 7 days  Plan: D/c cefepime Keflex 500mg  PO BID for 3 more days   Height: 6\' 5"  (195.6 cm) Weight: (!) 336 lb (152.4 kg) IBW/kg (Calculated) : 89.1  Temp (24hrs), Avg:99.6 F (37.6 C), Min:98.9 F (37.2 C), Max:100.3 F (37.9 C)  Recent Labs  Lab 03/05/19 1820 03/05/19 2031  03/06/19 0720 03/06/19 1344 03/07/19 0304 03/08/19 0339 03/09/19 0237  WBC 19.0*  --   --  16.1*  --  16.2* 15.1* 14.0*  CREATININE 6.82*  --    < > 6.19* 5.67* 4.97* 3.56* 2.99*  LATICACIDVEN  --  2.0*  --   --   --   --   --   --    < > = values in this interval not displayed.    Estimated Creatinine Clearance: 42.5 mL/min (A) (by C-G formula based on SCr of 2.99 mg/dL (H)).    No Known Allergies  Antimicrobials this admission: 9/21 cefepime >> 9/24 9/24 Cephalexin >>(9/26)  9/21 vancmycin >> x1 ED 9/21 flagyl >> x1 ED  Dose adjustments this admission:   Microbiology results:  BCx:   UCx: Klebsiella, Proteus  Sputum:    MRSA PCR:   Tinika Bucknam A. Levada Dy, PharmD, BCPS, FNKF Clinical Pharmacist [place] Please utilize Amion for appropriate phone number to reach the unit pharmacist ([place])   03/09/2019 9:42 AM

## 2019-03-09 NOTE — Progress Notes (Signed)
Report called to Heide Scales, RN at [facility] for impending discharge.  Patient awaiting [facility] for transport.

## 2019-03-09 NOTE — TOC Transition Note (Signed)
Transition of Care [facility]) - CM/SW Discharge Note   Patient Details  Name: Jacob Garza MRN: [id number] Date of Birth: 27-Oct-1955  Transition of Care [facility]) CM/SW Contact:  Ralph Dowdy, [place] Work Phone Number: 03/09/2019, 11:48 AM   Clinical Narrative:    Discharge to: [facility]  Discharge date: 03/09/2019 Transportation: [facility]  RN to call report to [phone]  Final next level of care: Skilled Nursing Facility Barriers to Discharge: No Barriers Identified   Patient Goals and CMS Choice Patient states their goals for this hospitalization and ongoing recovery are:: Wants to become stronger. CMS Medicare.gov Compare Post Acute Care list provided to:: Patient Choice offered to / list presented to : Patient  Discharge Placement   Existing PASRR number confirmed : 03/09/19          Patient chooses bed at: [facility] Patient to be transferred to facility by: [place] Name of family member notified: N/A Patient and family notified of of transfer: 03/09/19  Discharge Plan and Services In-house Referral: Clinical Social Work Discharge Planning Services: [employer] Consult            DME Arranged: N/A DME Agency: NA       HH Arranged: NA HH Agency: NA        Social Determinants of Health ([place]) Interventions     Readmission Risk Interventions No flowsheet data found.

## 2019-03-09 NOTE — Progress Notes (Signed)
[facility] NEPHROLOGY PROGRESS NOTE  Assessment/ Plan: Pt is a 63 y.o. yo male  with history of chronic pain, neuropathy with wheelchair-bound who was presented to the hospital after being found down on the floor found to have AKI with creatinine level 6.82, rhabdomyolysis and sepsis.  #Nonoliguric AKI: Exact baseline creatinine level unknown.  AKI likely due to combination of NSAIDs/rhabdomyolysis and sepsis.  UA with UTI.  Ultrasound kidneys with normal echogenicity and without obstruction. -Patient has good urine output and serum creatinine level continue to trend down to 2.9 today, off IV fluids since yesterday.  Acidosis improved.  -Expect continue renal recovery.  Patient can follow-up with primary care doctor after discharge, if no complete renal recovery then PCP can refer him to [facility].  I will sign off at this time.  Discussed the primary team.   #Acidosis due to combination of renal failure and lactic acidosis: Improved.  Improved with sodium bicarbonate.  #Hypernatremia: Improved  #Hyperkalemia: Improved, potassium level 4.2 today.  #Sepsis presumed from UTI:  Urine culture grew Klebsiella sensitive to cefepime.  Per primary team  #Physical deconditioning: PT OT evaluation.  Patient will likely need SNF on discharge.  Subjective: Seen and examined.  No new event.  Urine output around 1525 cc in 24 hours, he feels good.  Denies nausea, vomiting, chest pain, shortness of breath.  Objective Vital signs in last 24 hours: Vitals:   03/09/19 0406 03/09/19 0407 03/09/19 0806 03/09/19 0807  BP:   (!) 124/95 (!) 124/95  Pulse:    92  Resp:   (!) 22 19  Temp: 99.7 F (37.6 C)   98.9 F (37.2 C)  TempSrc: Oral   Axillary  SpO2:      Weight:  (!) 152.4 kg    Height:       Weight change: -29.5 kg  Intake/Output Summary (Last 24 hours) at 03/09/2019 0937 Last data filed at 03/09/2019 0630 Gross per 24 hour  Intake 360 ml  Output 1525 ml   Net -1165 ml       Labs: Basic Metabolic Panel: Recent Labs  Lab 03/07/19 0304 03/08/19 0339 03/09/19 0237  NA 140 141 142  K 4.4 4.2 3.8  CL 109 107 103  CO2 17* 22 27  GLUCOSE 104* 103* 110*  BUN 120* 98* 79*  CREATININE 4.97* 3.56* 2.99*  CALCIUM 8.2* 8.6* 8.6*  PHOS 5.7* 4.3  --    Liver Function Tests: Recent Labs  Lab 03/05/19 2033 03/06/19 0720 03/07/19 0304 03/08/19 0339  AST 102* 77*  --   --   ALT 72* 66*  --   --   ALKPHOS 218* 207*  --   --   BILITOT 2.9* 2.0*  --   --   PROT 7.8 7.5  --   --   ALBUMIN 2.6* 2.6* 2.0* 2.0*   No results for input(s): LIPASE, AMYLASE in the last 168 hours. No results for input(s): AMMONIA in the last 168 hours. CBC: Recent Labs  Lab 03/05/19 1820 03/06/19 0720 03/07/19 0304 03/08/19 0339 03/09/19 0237  WBC 19.0* 16.1* 16.2* 15.1* 14.0*  NEUTROABS 17.1*  --   --  12.3* 10.9*  HGB 15.0 15.0 13.3 13.5 12.3*  HCT 46.0 45.8 40.0 38.4* 37.0*  MCV 96.6 97.4 95.2 93.9 96.6  PLT 318 275 285 266 270   Cardiac Enzymes: Recent Labs  Lab 03/05/19 1820 03/06/19 0720 03/09/19 0237  CKTOTAL 2,155* 1,159* 185  CKMB  --  55.1*  --  CBG: No results for input(s): GLUCAP in the last 168 hours.  Iron Studies: No results for input(s): IRON, TIBC, TRANSFERRIN, FERRITIN in the last 72 hours. Studies/Results: Nm Hepato W/eject Fract  Result Date: 03/07/2019 CLINICAL DATA:  Abdominal pain.  History of cholelithiasis. EXAM: NUCLEAR MEDICINE HEPATOBILIARY IMAGING WITH GALLBLADDER EF TECHNIQUE: Sequential images of the abdomen were obtained out to 60 minutes following intravenous administration of radiopharmaceutical. After oral ingestion of Ensure, gallbladder ejection fraction was determined. At 60 min, normal ejection fraction is greater than 33%. RADIOPHARMACEUTICALS:  4.8 mCi Tc-4m  Choletec IV COMPARISON:  Abdominal ultrasound 03/05/2019 FINDINGS: There is prompt and symmetric uptake in the liver and prompt excretion into  the biliary tree which is visualized at 10 minutes. The gallbladder is visualized at 30 minutes. Activity is seen in the small bowel at 10 minutes. Patient ingested 8 ounces of Ensure and a gallbladder ejection fraction was calculated over 60 minutes at 82%. (Normal gallbladder ejection fraction with Ensure is greater than 33%.) IMPRESSION: Normal biliary patency study and normal gallbladder ejection fraction. Electronically Signed   By: Rudie Meyer M.D.   On: 03/07/2019 15:37    Medications: Infusions: . ceFEPime (MAXIPIME) IV 2 g (03/08/19 2100)    Scheduled Medications: . amLODipine  10 mg Oral Daily  . feeding supplement (NEPRO CARB STEADY)  237 mL Oral TID BM  . Gerhardt's butt cream   Topical BID  . heparin  5,000 Units Subcutaneous Q8H  . influenza vac split quadrivalent PF  0.5 mL Intramuscular Tomorrow-1000  . nystatin   Topical TID    have reviewed scheduled and prn medications.  Physical Exam: General:NAD, comfortable Heart:RRR, s1s2 nl Lungs: Clear b/l, no crackle Abdomen:soft, Non-tender, non-distended Extremities:No edema Neurology: Alert awake and following commands Skin: Multiple ulcers and excoriation around the groin and scrotal area.  Venecia Mehl Prasad Zola Runion 03/09/2019,9:37 AM  LOS: 4 days  Pager: [phone]

## 2019-03-10 LAB — CULTURE, BLOOD (ROUTINE X 2)
Culture: NO GROWTH
Culture: NO GROWTH

## 2019-09-21 ENCOUNTER — Telehealth: Payer: Self-pay | Admitting: General Practice

## 2019-09-21 NOTE — Telephone Encounter (Signed)
Nurse calling in regards to orders for wound care. Needs verbal confirmation for PT, OT, and SW for financial.   Patient has a new patient appointment with Dr Earlene Plater on 04/19.  Please contact Marjean Donna at [phone].

## 2019-09-21 NOTE — Telephone Encounter (Signed)
Until patient has established with office we are unable to give verbal orders.

## 2019-09-29 ENCOUNTER — Telehealth: Payer: Self-pay

## 2019-09-29 NOTE — Telephone Encounter (Signed)
Called patient to do their pre-visit COVID screening.  Call went to voicemail. Unable to do prescreening.  

## 2019-10-02 ENCOUNTER — Ambulatory Visit: Payer: Medicare Other | Admitting: Internal Medicine

## 2020-01-08 ENCOUNTER — Telehealth: Payer: Self-pay

## 2020-01-08 NOTE — Telephone Encounter (Signed)
Pt was called @ 3:39pm per NP Dorene Grebe to discuss if Pt wants to establish [facility].

## 2021-01-10 IMAGING — NM NM HEPATO W/GB/PHARM/[PERSON_NAME]
2 series · 12 of 12 positions shown · non-contrast
Comparison: Abdominal ultrasound 03/05/2019

CLINICAL DATA: Abdominal pain.  History of cholelithiasis.

EXAM:
NUCLEAR MEDICINE HEPATOBILIARY IMAGING WITH GALLBLADDER EF
TECHNIQUE: Sequential images of the abdomen were obtained [DATE] minutes
following intravenous administration of radiopharmaceutical. After
oral ingestion of Ensure, gallbladder ejection fraction was
determined. At 60 min, normal ejection fraction is greater than 33%.
RADIOPHARMACEUTICALS:  4.8 mCi 7c-XXm  Choletec IV

[he hepatobiliary · 4.52mm/px · 6 of 60 frames shown (1 of 2)]
[frame 6/60]
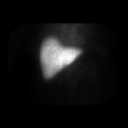
[frame 16/60]
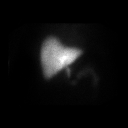
[frame 26/60]
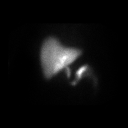
[frame 36/60]
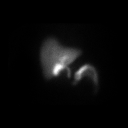
[frame 46/60]
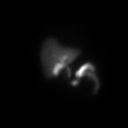
[frame 56/60]
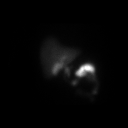

[he hepatobiliary · 4.52mm/px · 6 of 60 frames shown (2 of 2)]
[frame 6/60]
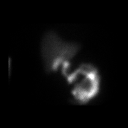
[frame 16/60]
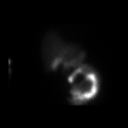
[frame 26/60]
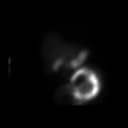
[frame 36/60]
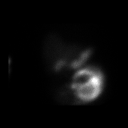
[frame 46/60]
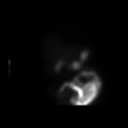
[frame 56/60]
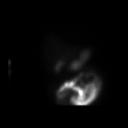

[12 of 12 positions shown; findings below may reference images not displayed]

FINDINGS: There is prompt and symmetric uptake in the liver and prompt
excretion into the biliary tree which is visualized at 10 minutes.
The gallbladder is visualized at 30 minutes. Activity is seen in the
small bowel at 10 minutes.

Patient ingested 8 ounces of Ensure and a gallbladder ejection
fraction was calculated over 60 minutes at 82%.

(Normal gallbladder ejection fraction with Ensure is greater than
33%.)
IMPRESSION: Normal biliary patency study and normal gallbladder ejection
fraction.
